# Patient Record
Sex: Male | Born: 1965 | Race: White | Hispanic: No | Marital: Married | Smoking: Former smoker
Health system: Southern US, Community
[De-identification: ages and names within clinical notes are randomized; demographics above are authoritative.]

## PROBLEM LIST (undated history)

## (undated) DIAGNOSIS — F32A Depression, unspecified: Secondary | ICD-10-CM

## (undated) DIAGNOSIS — F329 Major depressive disorder, single episode, unspecified: Secondary | ICD-10-CM

## (undated) DIAGNOSIS — G4730 Sleep apnea, unspecified: Secondary | ICD-10-CM

## (undated) DIAGNOSIS — J449 Chronic obstructive pulmonary disease, unspecified: Secondary | ICD-10-CM

## (undated) DIAGNOSIS — K449 Diaphragmatic hernia without obstruction or gangrene: Secondary | ICD-10-CM

## (undated) DIAGNOSIS — K219 Gastro-esophageal reflux disease without esophagitis: Secondary | ICD-10-CM

## (undated) DIAGNOSIS — N400 Enlarged prostate without lower urinary tract symptoms: Secondary | ICD-10-CM

## (undated) DIAGNOSIS — Z87898 Personal history of other specified conditions: Secondary | ICD-10-CM

## (undated) DIAGNOSIS — I10 Essential (primary) hypertension: Secondary | ICD-10-CM

## (undated) DIAGNOSIS — F419 Anxiety disorder, unspecified: Secondary | ICD-10-CM

## (undated) HISTORY — DX: Anxiety disorder, unspecified: F41.9

## (undated) HISTORY — DX: Benign prostatic hyperplasia without lower urinary tract symptoms: N40.0

## (undated) HISTORY — DX: Major depressive disorder, single episode, unspecified: F32.9

## (undated) HISTORY — PX: WISDOM TOOTH EXTRACTION: SHX21

## (undated) HISTORY — DX: Depression, unspecified: F32.A

## (undated) HISTORY — DX: Diaphragmatic hernia without obstruction or gangrene: K44.9

---

## 1993-07-24 DIAGNOSIS — Z87898 Personal history of other specified conditions: Secondary | ICD-10-CM

## 1993-07-24 HISTORY — DX: Personal history of other specified conditions: Z87.898

## 2005-06-22 ENCOUNTER — Emergency Department: Payer: Self-pay | Admitting: Emergency Medicine

## 2006-05-10 ENCOUNTER — Emergency Department: Payer: Self-pay | Admitting: Emergency Medicine

## 2006-05-10 ENCOUNTER — Other Ambulatory Visit: Payer: Self-pay

## 2006-05-11 ENCOUNTER — Emergency Department: Payer: Self-pay | Admitting: Emergency Medicine

## 2006-05-11 ENCOUNTER — Ambulatory Visit: Payer: Self-pay | Admitting: Emergency Medicine

## 2007-05-31 IMAGING — CT CT STONE STUDY
1 of 2 series · 16 of 32 positions shown, 20 images · non-contrast
Comparison: none

REASON FOR EXAM: Abdominal pain, [HOSPITAL]
COMMENTS:

[Series 2: stone · axial · 0.80mm/px · z∈[-480,-34]mm · 16 of 161 slices shown, 20 images]
[im 6/161  soft-tissue]
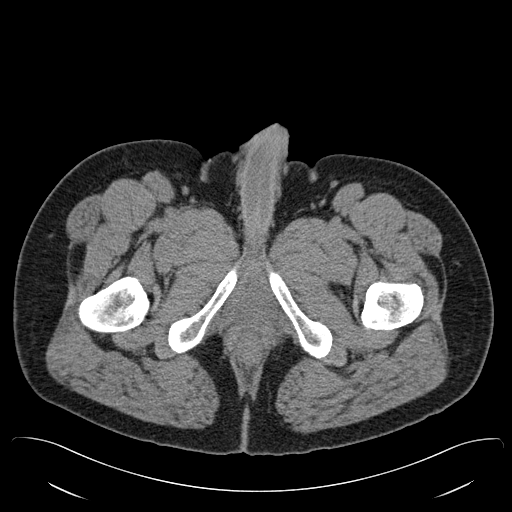
[im 6/161  bone]
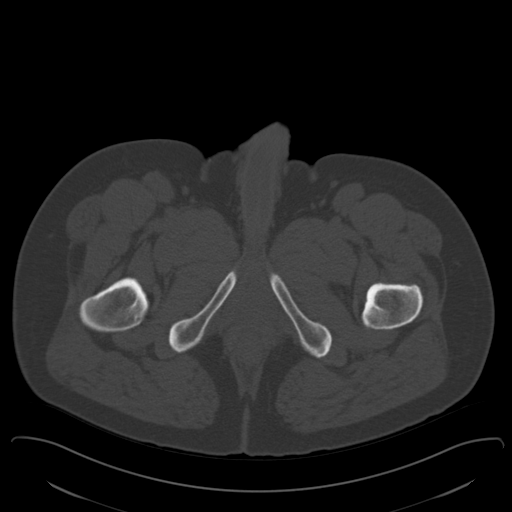
[im 18/161  soft-tissue]
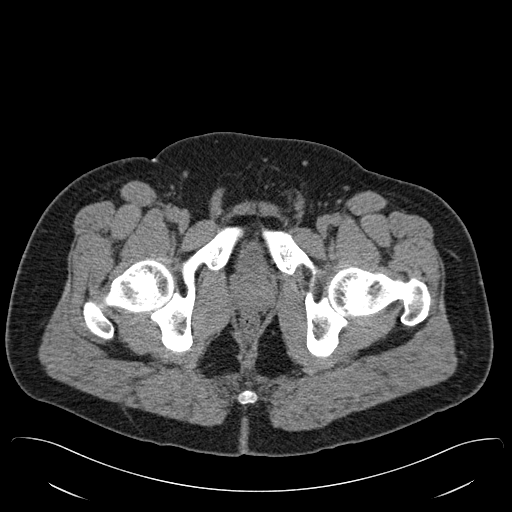
[im 29/161  soft-tissue]
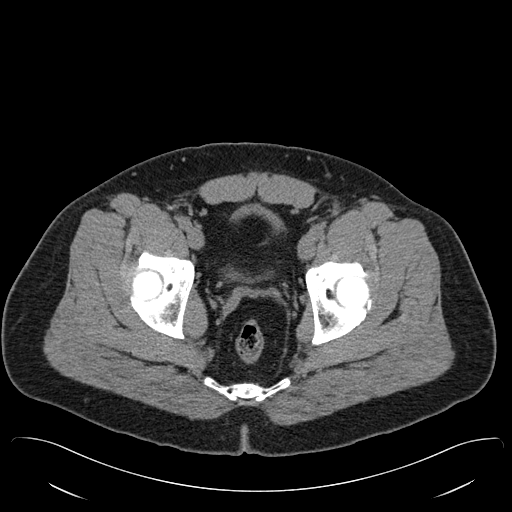
[im 41/161  soft-tissue]
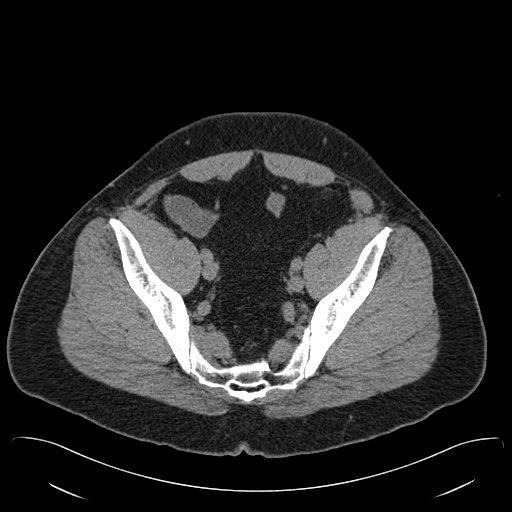
[im 52/161  soft-tissue]
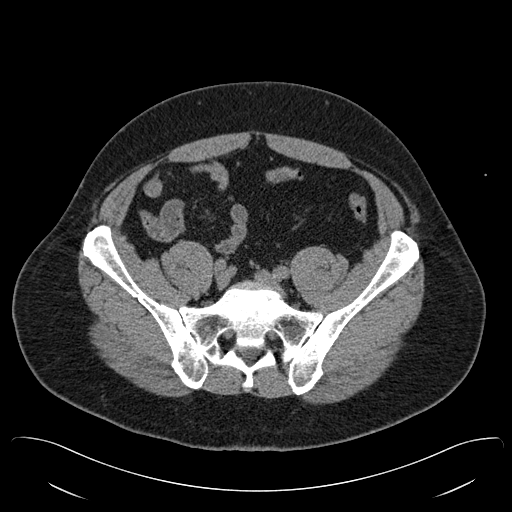
[im 63/161  soft-tissue]
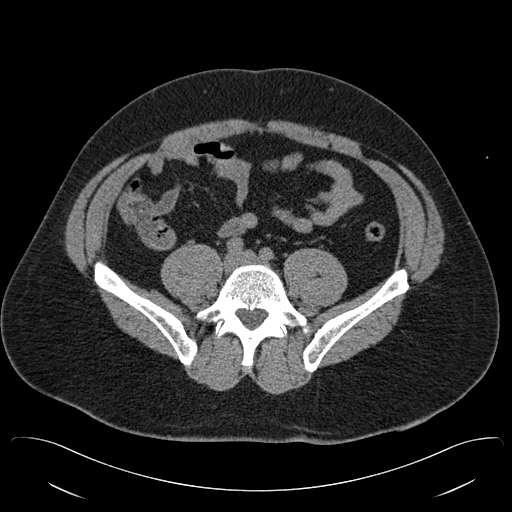
[im 75/161  soft-tissue]
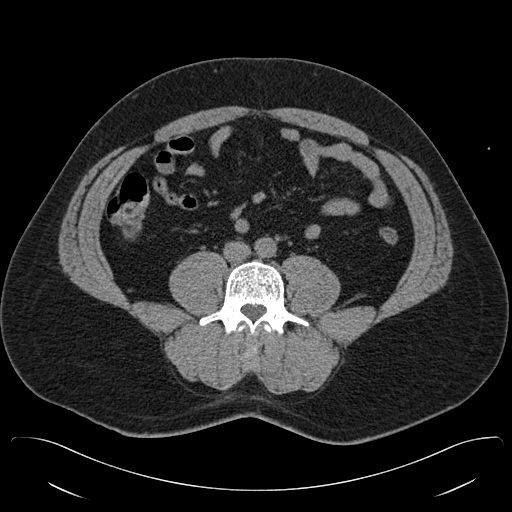
[im 86/161  soft-tissue]
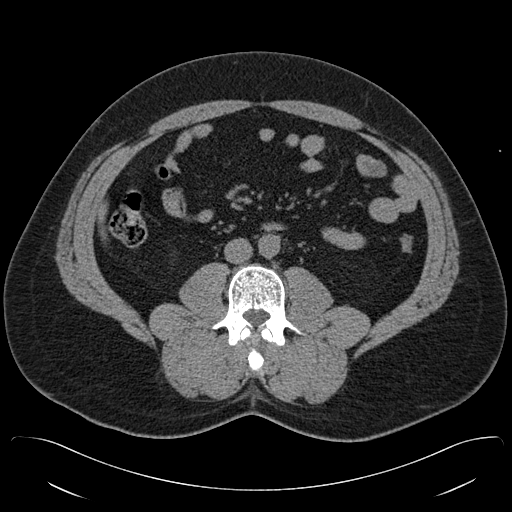
[im 98/161  soft-tissue]
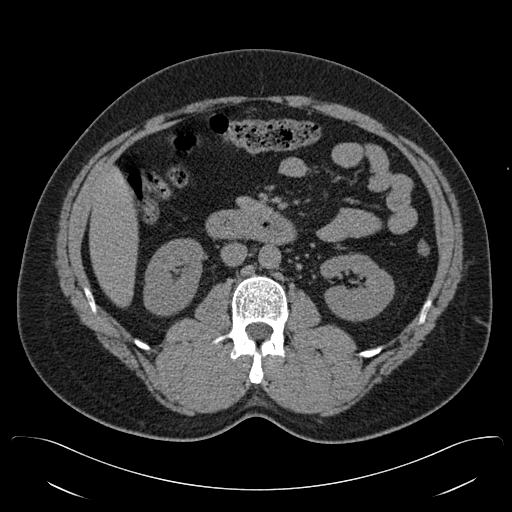
[im 98/161  bone]
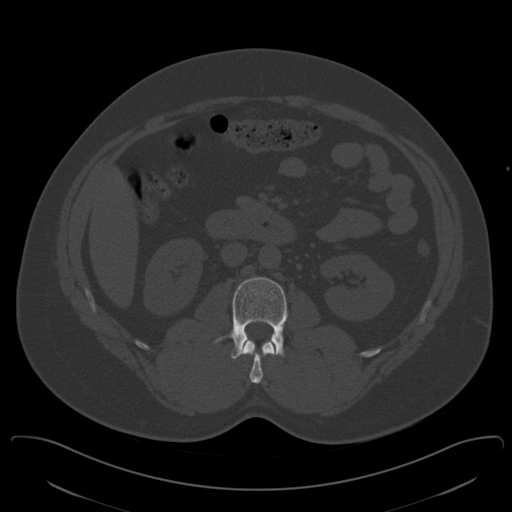
[im 109/161  soft-tissue]
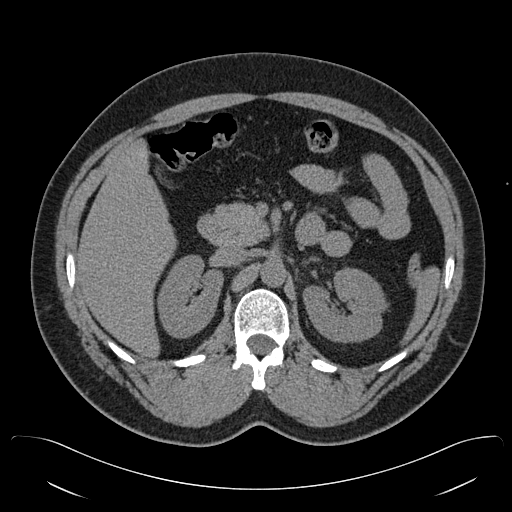
[im 121/161  soft-tissue]
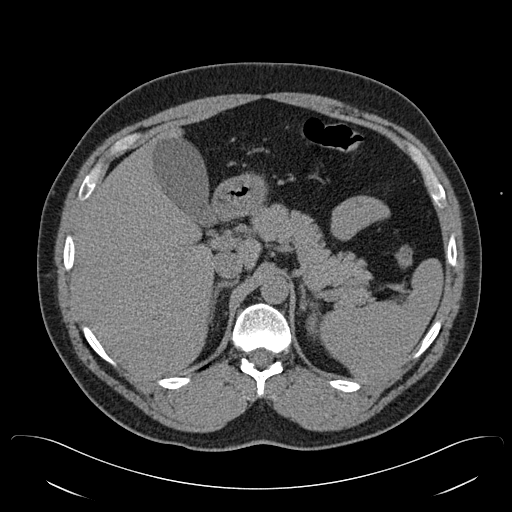
[im 132/161  soft-tissue]
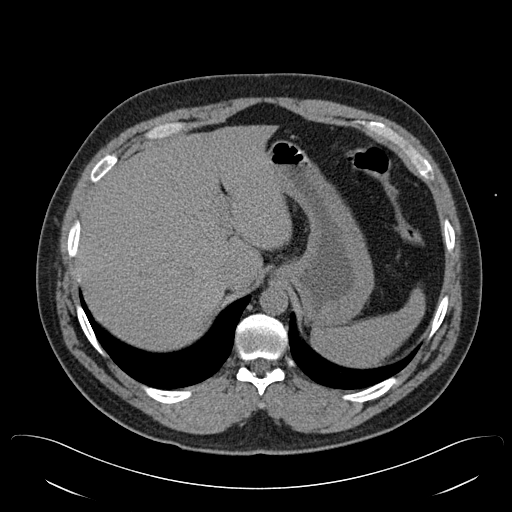
[im 138/161  lung]
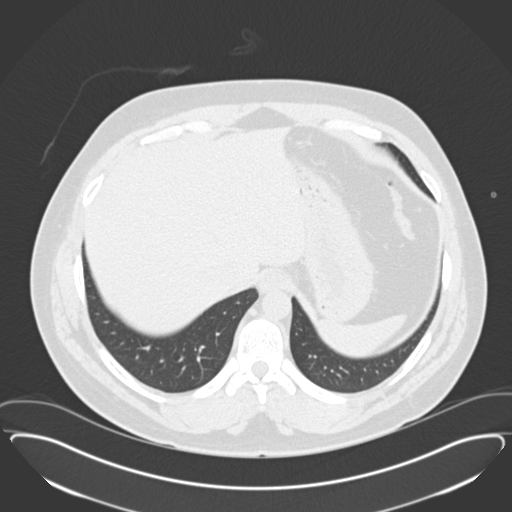
[im 143/161  soft-tissue]
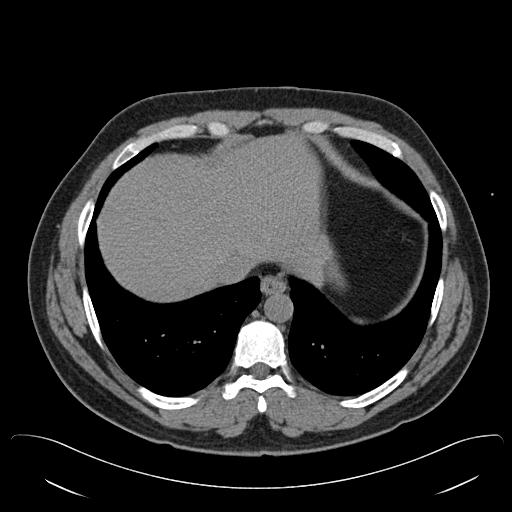
[im 143/161  lung]
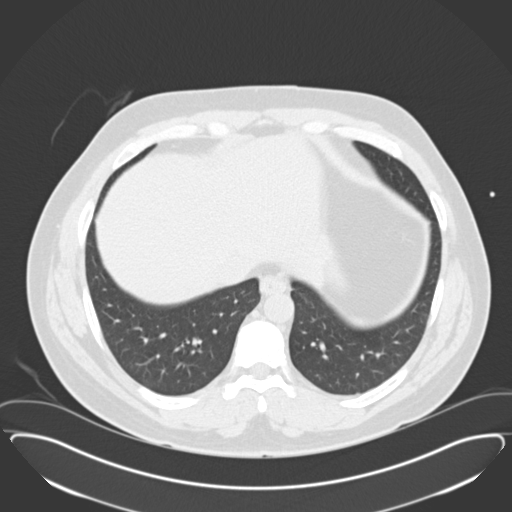
[im 149/161  lung]
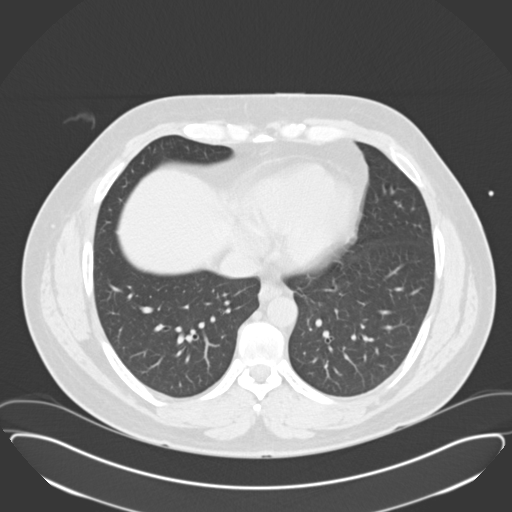
[im 155/161  soft-tissue]
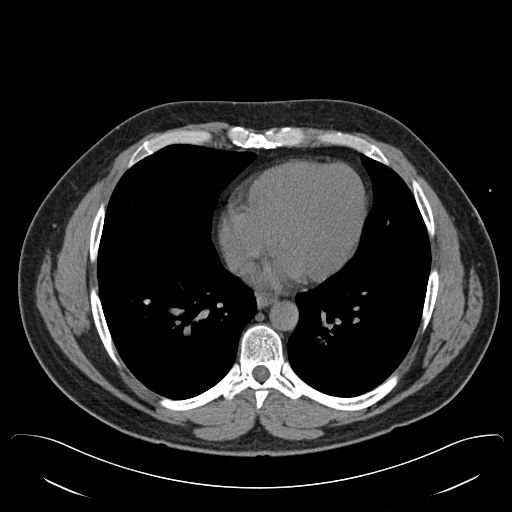
[im 155/161  lung]
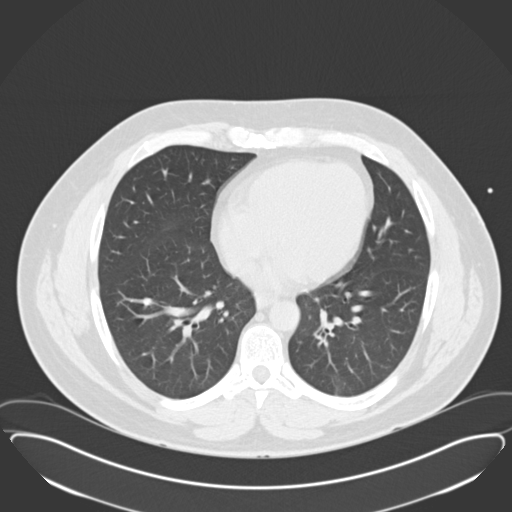

[16 of 32 positions shown; findings below may reference images not displayed]

PROCEDURE:     CT  - CT ABDOMEN /PELVIS WO (STONE)  - May 11, 2006  [DATE]

RESULT:       Unenhanced emergent abdominal and pelvic CT was performed for
LEFT upper quadrant pain and hematuria.  Report was faxed to the Emergency
Room.

No renal or ureteral calculi are identified.  No hydronephrosis.  No major
organ abnormalities are noted on this unenhanced abdominal/pelvic CT.  No
ascites is present.  The accompanying images through the lung bases reveal
the lung bases to be clear with no effusions.
IMPRESSION: No renal or ureteral calculi identified.

## 2007-05-31 IMAGING — CT CT ABD-PELV W/ CM
1 of 2 series · 16 of 32 positions shown, 20 images · non-contrast
Comparison: none

REASON FOR EXAM: diverticulitis
COMMENTS:

[Series 2: soft tissue · axial · 0.79mm/px · z∈[-249,+191]mm · 16 of 98 slices shown, 20 images]
[im 5/98  soft-tissue]
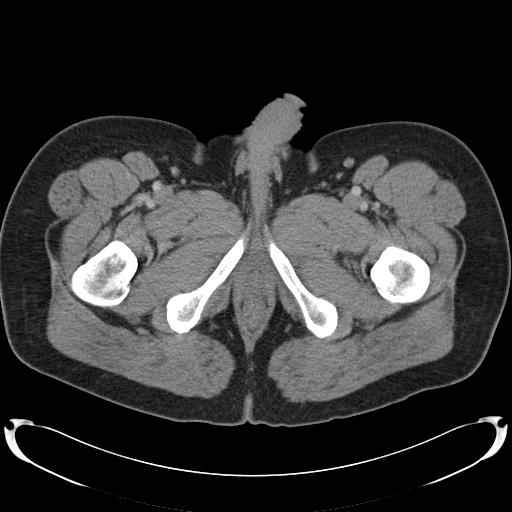
[im 5/98  bone]
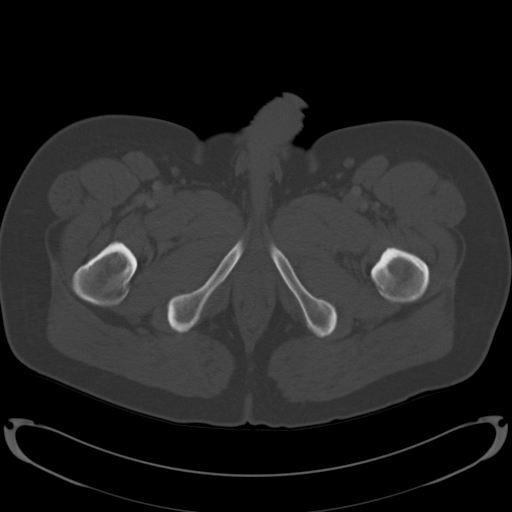
[im 13/98  soft-tissue]
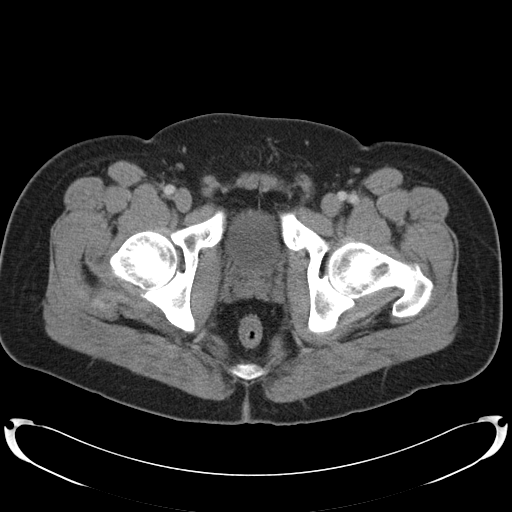
[im 21/98  soft-tissue]
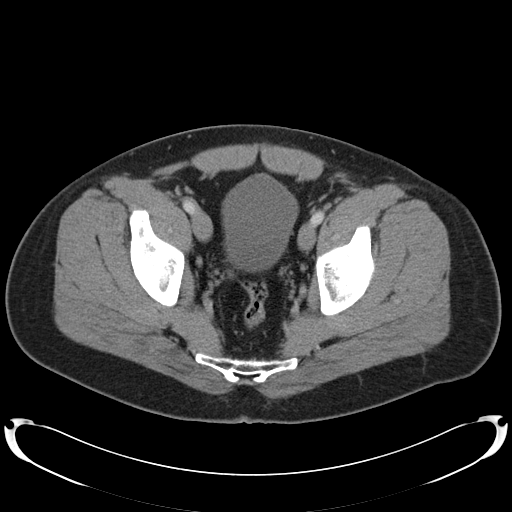
[im 25/98  soft-tissue]
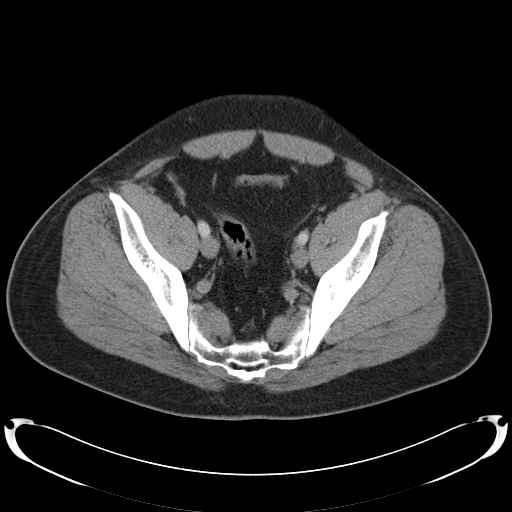
[im 33/98  soft-tissue]
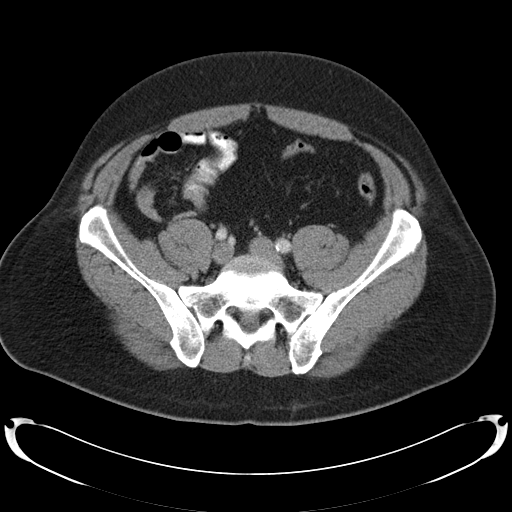
[im 41/98  soft-tissue]
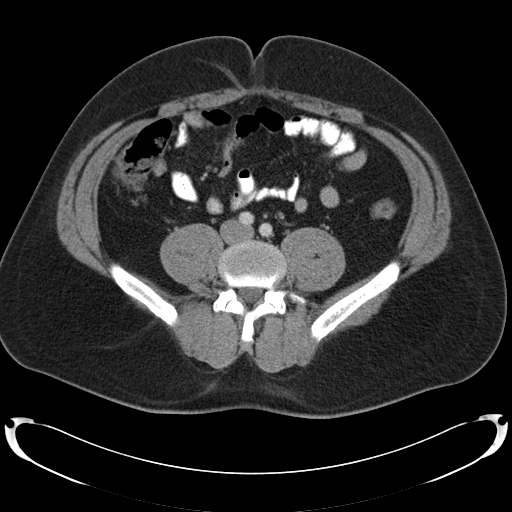
[im 45/98  soft-tissue]
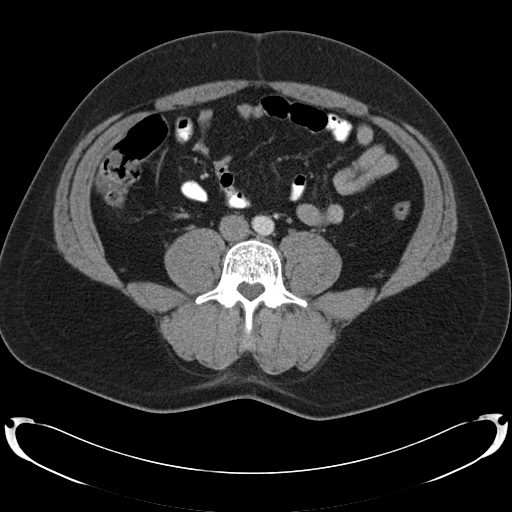
[im 53/98  soft-tissue]
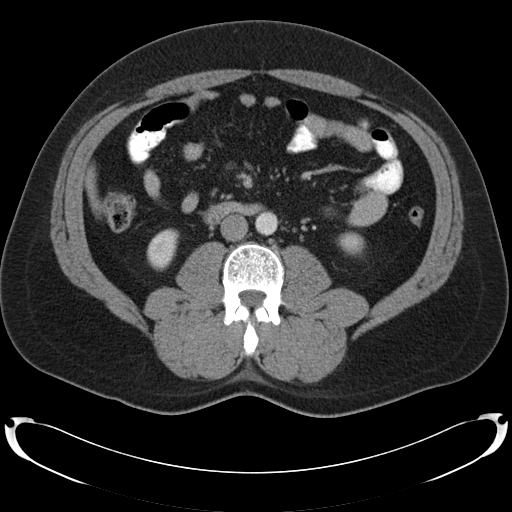
[im 57/98  soft-tissue]
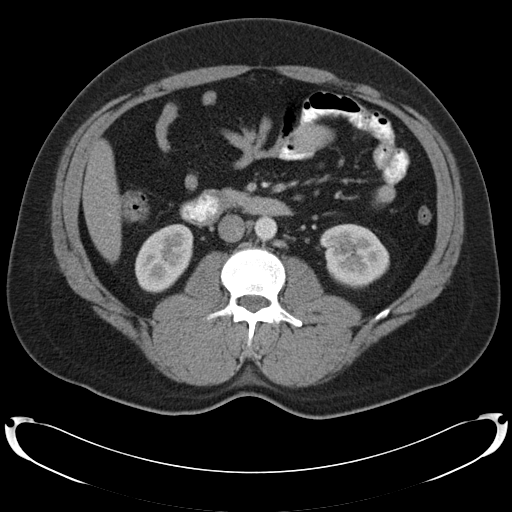
[im 57/98  bone]
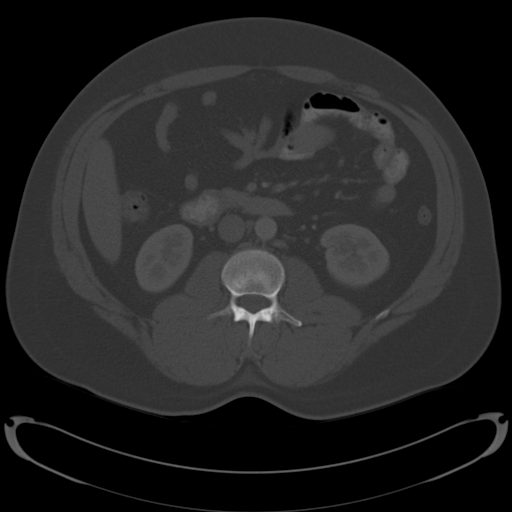
[im 65/98  soft-tissue]
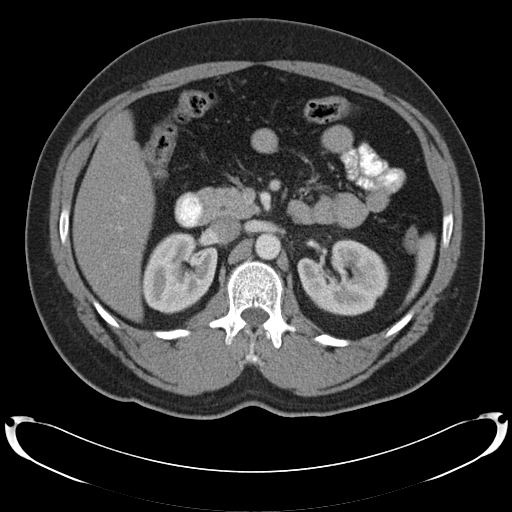
[im 73/98  soft-tissue]
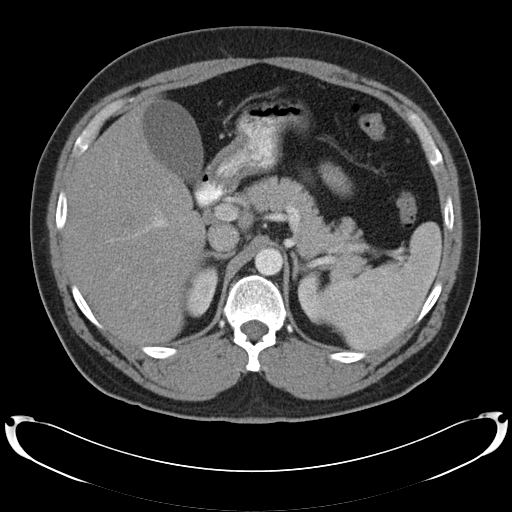
[im 77/98  soft-tissue]
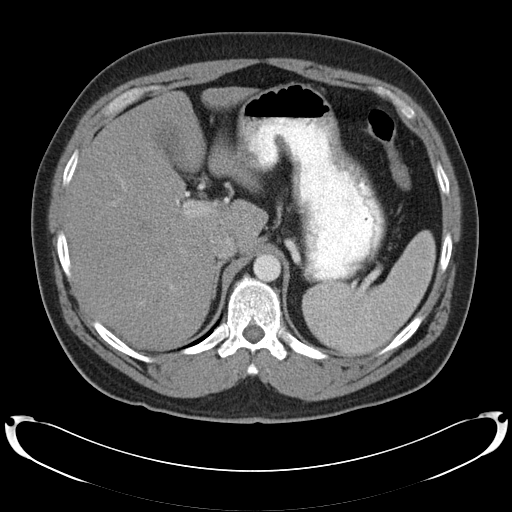
[im 81/98  lung]
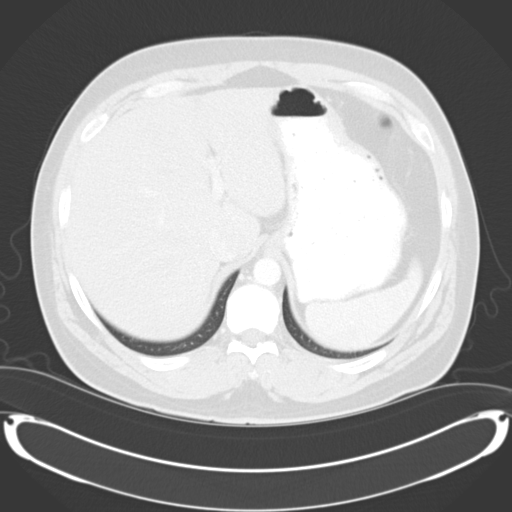
[im 85/98  soft-tissue]
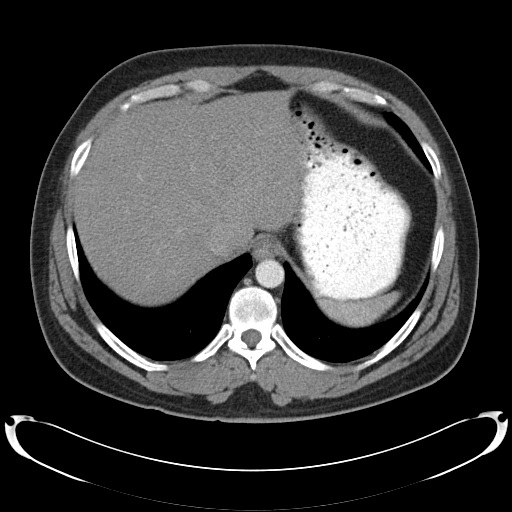
[im 85/98  lung]
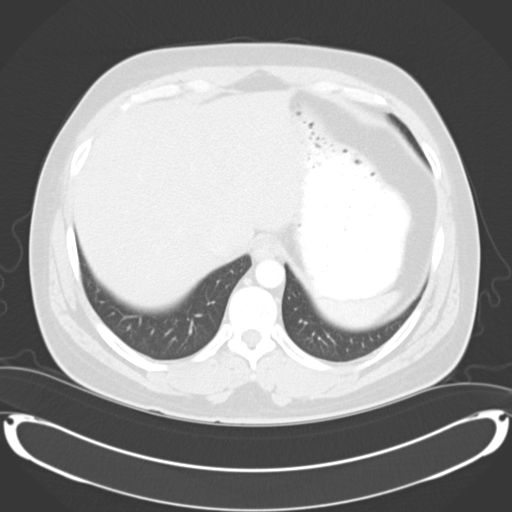
[im 89/98  lung]
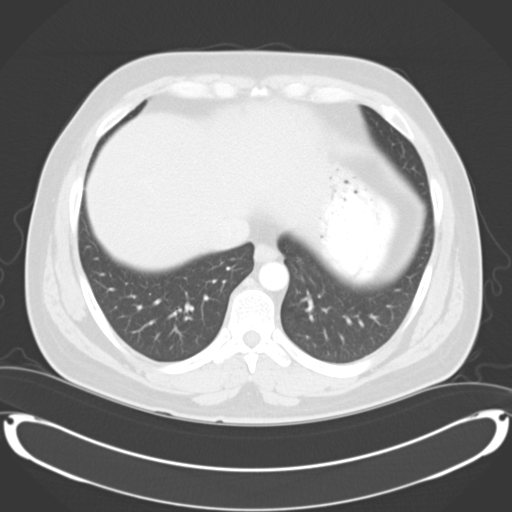
[im 93/98  soft-tissue]
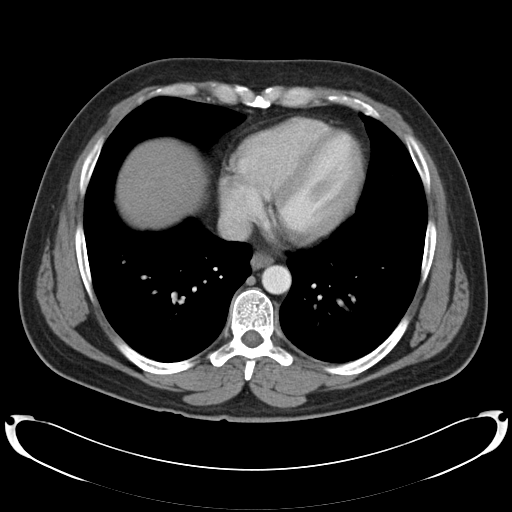
[im 93/98  lung]
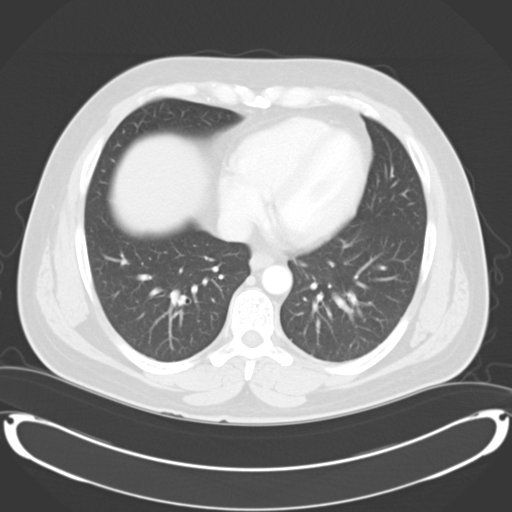

[16 of 32 positions shown; findings below may reference images not displayed]

PROCEDURE:     CT  - CT ABDOMEN / PELVIS  W  - May 11, 2006  [DATE]

RESULT:     Emergent scan is performed for diverticulitis.  The scan is
performed with oral and IV contrast and compared with the exam of the
previous evening, 05/11/06.

No evidence of diverticulitis is seen.  No appendicitis.  Both kidneys
continue to excrete the contrast material.

The visualized lung bases are basically clear.
IMPRESSION: No evidence of diverticulitis.  Report was called to the
emergency room at the conclusion of the dictation.

## 2010-07-24 HISTORY — PX: COLONOSCOPY: SHX174

## 2015-07-08 ENCOUNTER — Ambulatory Visit (INDEPENDENT_AMBULATORY_CARE_PROVIDER_SITE_OTHER): Payer: Worker's Compensation | Admitting: General Surgery

## 2015-07-08 ENCOUNTER — Encounter: Payer: Self-pay | Admitting: *Deleted

## 2015-07-08 ENCOUNTER — Encounter: Payer: Self-pay | Admitting: General Surgery

## 2015-07-08 VITALS — BP 132/82 | HR 78 | Resp 16 | Ht <= 58 in | Wt 214.0 lb

## 2015-07-08 DIAGNOSIS — K4090 Unilateral inguinal hernia, without obstruction or gangrene, not specified as recurrent: Secondary | ICD-10-CM | POA: Diagnosis not present

## 2015-07-08 NOTE — Progress Notes (Signed)
Patient ID: Todd SensingStephen Walter, male   DOB: 09-26-1965, 49 y.o.   MRN: [id number]  Chief Complaint  Patient presents with  . Other    left inguinal hernia    HPI Todd Walter is a 49 y.o. male here today for a evaluation of a left inguinal hernia . He noticed this in Novermeber 14,2016. He was moving some heavy package at work and noticed a sharpe pain in his left groin.  He states he has pain with activity. There is a swelling in left groin which he sometimes can push back and other times it goes down on resting. I have reviewed the history of present illness with the patient. HPI  Past Medical History  Diagnosis Date  . Hiatal hernia   . Prostate enlargement   . Anxiety   . Depression     Past Surgical History  Procedure Laterality Date  . Wisdom tooth extraction    . Colonoscopy  2012    Family History  Problem Relation Age of Onset  . Cancer Father     kidney  . Cancer Brother     Social History Social History  Substance Use Topics  . Smoking status: Former Smoker -- 25 years    Quit date: 07/25/2007  . Smokeless tobacco: Never Used  . Alcohol Use: 0.0 oz/week    0 Standard drinks or equivalent per week     Comment: occasionally    No Known Allergies  Current Outpatient Prescriptions  Medication Sig Dispense Refill  . FLUoxetine (PROZAC) 20 MG tablet Take 20 mg by mouth daily.    . hydrochlorothiazide (HYDRODIURIL) 25 MG tablet     . meloxicam (MOBIC) 15 MG tablet     . omeprazole (PRILOSEC) 20 MG capsule      No current facility-administered medications for this visit.    Review of Systems Review of Systems  Constitutional: Negative.   Respiratory: Negative.   Cardiovascular: Negative.     Blood pressure 132/82, pulse 78, resp. rate 16, height 4\' 10"  (1.473 m), weight 214 lb (97.07 kg).  Physical Exam Physical Exam  Constitutional: He is oriented to person, place, and time. He appears well-developed and well-nourished.  Eyes: Conjunctivae are  normal. No scleral icterus.  Neck: Neck supple.  Cardiovascular: Normal rate, regular rhythm and normal heart sounds.   Pulmonary/Chest: Effort normal and breath sounds normal.  Abdominal: Soft. Normal appearance and bowel sounds are normal. There is no tenderness. A hernia is present. Hernia confirmed positive in the left inguinal area.  Lymphadenopathy:    He has no cervical adenopathy.  Neurological: He is alert and oriented to person, place, and time.  Skin: Skin is warm and dry.    Data Reviewed Notes reviewed  Assessment    Left inguinal hernia-symptomatic    Plan   Discussed hernia repair in full with pt and he is agreeable. Both laparoscopic and open approaches discussed. He is o with laparoscopic approach. Hernia precautions and incarceration were discussed with the patient. If they develop symptoms of an incarcerated hernia, they were encouraged to seek prompt medical attention.  I have recommended repair of the hernia using mesh on an outpatient basis in the near future. The risk of infection was reviewed. The role of prosthetic mesh to minimize the risk of recurrence was reviewed.    Patient's surgery has been scheduled for 07-27-15 at [facility].   This information has been scribed by Dorathy DaftMarsha Hatch RNBC.    PCP:  Evette Doffingurham  Kimberlly Norgard G 07/08/2015,  11:42 AM

## 2015-07-08 NOTE — Patient Instructions (Addendum)

## 2015-07-20 ENCOUNTER — Inpatient Hospital Stay: Admission: RE | Admit: 2015-07-20 | Payer: Self-pay | Source: Ambulatory Visit

## 2015-07-20 ENCOUNTER — Encounter: Payer: Self-pay | Admitting: *Deleted

## 2015-07-20 NOTE — Patient Instructions (Signed)
  Your procedure is scheduled on:July 27, 2015 (Tuesday) Report to Same Day Surgery [facility] Second Floor To find out your arrival time please call [phone] between 1PM - 3PM on July 23, 2015 (Friday)  Remember: Instructions that are not followed completely may result in serious medical risk, up to and including death, or upon the discretion of your surgeon and anesthesiologist your surgery may need to be rescheduled.    ___x_ 1. Do not eat food or drink liquids after midnight. No gum chewing or hard candies.     ___x_ 2. No Alcohol for 24 hours before or after surgery.   ____ 3. Bring all medications with you on the day of surgery if instructed.    ___x_ 4. Notify your doctor if there is any change in your medical condition     (cold, fever, infections).     Do not wear jewelry, make-up, hairpins, clips or nail polish.  Do not wear lotions, powders, or perfumes. You may wear deodorant.  Do not shave 48 hours prior to surgery. Men may shave face and neck.  Do not bring valuables to the hospital.    [facility] belongings or valuables.               Contacts, dentures or bridgework may not be worn into surgery.  Leave your suitcase in the car. After surgery it may be brought to your room.  For patients admitted to the hospital, discharge time is determined by your                treatment team.   Patients discharged the day of surgery will not be allowed to drive home.   Please read over the following fact sheets that you were given:   Surgical Site Infection Prevention   _x___ Take these medicines the morning of surgery with A SIP OF WATER:    1. Omeprazole  2.   3.   4.  5.  6.  ____ Fleet Enema (as directed)   _x___ Use CHG Soap as directed  ____ Use inhalers on the day of surgery  ____ Stop metformin 2 days prior to surgery    ____ Take 1/2 of usual insulin dose the night before surgery and none on the morning of surgery.    ____ Stop Coumadin/Plavix/aspirin on   __x__ Stop Anti-inflammatories on (STOP MELOXICAM  NOW) NO NSAIDS , TYLENOL OK TO TAKE FOR PAIN IF NEEDED   ____ Stop supplements until after surgery.    _x__ Bring C-Pap to the hospital.

## 2015-07-22 ENCOUNTER — Encounter
Admission: RE | Admit: 2015-07-22 | Discharge: 2015-07-22 | Disposition: A | Discharge status: Home / Self Care | Payer: Worker's Compensation | Source: Ambulatory Visit | Attending: General Surgery | Admitting: General Surgery

## 2015-07-22 DIAGNOSIS — Z01812 Encounter for preprocedural laboratory examination: Secondary | ICD-10-CM | POA: Insufficient documentation

## 2015-07-22 DIAGNOSIS — Z01810 Encounter for preprocedural cardiovascular examination: Secondary | ICD-10-CM | POA: Diagnosis present

## 2015-07-22 LAB — CBC
HCT: 43.2 % (ref 40.0–52.0)
HEMOGLOBIN: 14.4 g/dL (ref 13.0–18.0)
MCH: 28.3 pg (ref 26.0–34.0)
MCHC: 33.4 g/dL (ref 32.0–36.0)
MCV: 84.7 fL (ref 80.0–100.0)
PLATELETS: 303 10*3/uL (ref 150–440)
RBC: 5.1 MIL/uL (ref 4.40–5.90)
RDW: 13.4 % (ref 11.5–14.5)
WBC: 8.7 10*3/uL (ref 3.8–10.6)

## 2015-07-22 LAB — DIFFERENTIAL
BASOS PCT: 1 %
Basophils Absolute: 0.1 10*3/uL (ref 0–0.1)
EOS PCT: 5 %
Eosinophils Absolute: 0.4 10*3/uL (ref 0–0.7)
LYMPHS ABS: 2.7 10*3/uL (ref 1.0–3.6)
LYMPHS PCT: 31 %
MONO ABS: 0.6 10*3/uL (ref 0.2–1.0)
Monocytes Relative: 7 %
NEUTROS ABS: 4.9 10*3/uL (ref 1.4–6.5)
NEUTROS PCT: 56 %

## 2015-07-22 LAB — POTASSIUM: POTASSIUM: 4.1 mmol/L (ref 3.5–5.1)

## 2015-07-27 ENCOUNTER — Encounter
Admission: RE | Disposition: A | Discharge status: Home / Self Care | Payer: Self-pay | Source: Ambulatory Visit | Attending: General Surgery

## 2015-07-27 ENCOUNTER — Ambulatory Visit: Payer: Worker's Compensation | Admitting: Anesthesiology

## 2015-07-27 ENCOUNTER — Ambulatory Visit
Admission: RE | Admit: 2015-07-27 | Discharge: 2015-07-27 | Disposition: A | Discharge status: Home / Self Care | Payer: Worker's Compensation | Source: Ambulatory Visit | Attending: General Surgery | Admitting: General Surgery

## 2015-07-27 DIAGNOSIS — K4090 Unilateral inguinal hernia, without obstruction or gangrene, not specified as recurrent: Secondary | ICD-10-CM

## 2015-07-27 DIAGNOSIS — Z9889 Other specified postprocedural states: Secondary | ICD-10-CM | POA: Diagnosis not present

## 2015-07-27 DIAGNOSIS — F419 Anxiety disorder, unspecified: Secondary | ICD-10-CM | POA: Diagnosis not present

## 2015-07-27 DIAGNOSIS — Z8051 Family history of malignant neoplasm of kidney: Secondary | ICD-10-CM | POA: Insufficient documentation

## 2015-07-27 DIAGNOSIS — F329 Major depressive disorder, single episode, unspecified: Secondary | ICD-10-CM | POA: Diagnosis not present

## 2015-07-27 DIAGNOSIS — N400 Enlarged prostate without lower urinary tract symptoms: Secondary | ICD-10-CM | POA: Diagnosis not present

## 2015-07-27 DIAGNOSIS — K4020 Bilateral inguinal hernia, without obstruction or gangrene, not specified as recurrent: Secondary | ICD-10-CM | POA: Insufficient documentation

## 2015-07-27 DIAGNOSIS — Z87891 Personal history of nicotine dependence: Secondary | ICD-10-CM | POA: Diagnosis not present

## 2015-07-27 DIAGNOSIS — K449 Diaphragmatic hernia without obstruction or gangrene: Secondary | ICD-10-CM | POA: Diagnosis not present

## 2015-07-27 HISTORY — DX: Sleep apnea, unspecified: G47.30

## 2015-07-27 HISTORY — PX: INGUINAL HERNIA REPAIR: SHX194

## 2015-07-27 HISTORY — DX: Chronic obstructive pulmonary disease, unspecified: J44.9

## 2015-07-27 HISTORY — DX: Essential (primary) hypertension: I10

## 2015-07-27 HISTORY — DX: Gastro-esophageal reflux disease without esophagitis: K21.9

## 2015-07-27 HISTORY — DX: Personal history of other specified conditions: Z87.898

## 2015-07-27 SURGERY — REPAIR, HERNIA, INGUINAL, LAPAROSCOPIC
Anesthesia: General | Laterality: Bilateral | Wound class: Clean

## 2015-07-27 MED ORDER — ACETAMINOPHEN 10 MG/ML IV SOLN
INTRAVENOUS | Status: DC | PRN
  Administered 2015-07-27: 1000 mg via INTRAVENOUS

## 2015-07-27 MED ORDER — PROMETHAZINE HCL 25 MG/ML IJ SOLN
6.2500 mg | INTRAMUSCULAR | Status: DC | PRN
  Administered 2015-07-27: 6.25 mg via INTRAVENOUS

## 2015-07-27 MED ORDER — PHENYLEPHRINE HCL 10 MG/ML IJ SOLN
INTRAMUSCULAR | Status: DC | PRN
  Administered 2015-07-27 (×2): 100 ug via INTRAVENOUS

## 2015-07-27 MED ORDER — CEFAZOLIN SODIUM-DEXTROSE 2-3 GM-% IV SOLR
INTRAVENOUS | Status: AC
  Filled 2015-07-27: qty 50

## 2015-07-27 MED ORDER — FENTANYL CITRATE (PF) 100 MCG/2ML IJ SOLN
25.0000 ug | INTRAMUSCULAR | Status: DC | PRN
  Administered 2015-07-27: 25 ug via INTRAVENOUS

## 2015-07-27 MED ORDER — LACTATED RINGERS IV SOLN
INTRAVENOUS | Status: DC
  Administered 2015-07-27 (×2): via INTRAVENOUS

## 2015-07-27 MED ORDER — ROCURONIUM BROMIDE 100 MG/10ML IV SOLN
INTRAVENOUS | Status: DC | PRN
  Administered 2015-07-27: 40 mg via INTRAVENOUS
  Administered 2015-07-27: 10 mg via INTRAVENOUS

## 2015-07-27 MED ORDER — KETOROLAC TROMETHAMINE 30 MG/ML IJ SOLN
INTRAMUSCULAR | Status: DC | PRN
  Administered 2015-07-27: 30 mg via INTRAVENOUS

## 2015-07-27 MED ORDER — GLYCOPYRROLATE 0.2 MG/ML IJ SOLN
INTRAMUSCULAR | Status: DC | PRN
  Administered 2015-07-27: .6 mg via INTRAVENOUS

## 2015-07-27 MED ORDER — OXYCODONE-ACETAMINOPHEN 5-325 MG PO TABS
1.0000 | ORAL_TABLET | ORAL | Status: AC | PRN
  Administered 2015-07-27: 1 via ORAL

## 2015-07-27 MED ORDER — PROMETHAZINE HCL 25 MG/ML IJ SOLN
INTRAMUSCULAR | Status: AC
  Filled 2015-07-27: qty 1

## 2015-07-27 MED ORDER — SODIUM CHLORIDE 0.9 % IJ SOLN
INTRAMUSCULAR | Status: AC
  Filled 2015-07-27: qty 10

## 2015-07-27 MED ORDER — MIDAZOLAM HCL 2 MG/2ML IJ SOLN
INTRAMUSCULAR | Status: DC | PRN
  Administered 2015-07-27: 2 mg via INTRAVENOUS

## 2015-07-27 MED ORDER — DEXAMETHASONE SODIUM PHOSPHATE 10 MG/ML IJ SOLN
INTRAMUSCULAR | Status: DC | PRN
  Administered 2015-07-27: 10 mg via INTRAVENOUS

## 2015-07-27 MED ORDER — CEFAZOLIN SODIUM-DEXTROSE 2-3 GM-% IV SOLR
2.0000 g | INTRAVENOUS | Status: AC
  Administered 2015-07-27: 2 g via INTRAVENOUS

## 2015-07-27 MED ORDER — ACETAMINOPHEN 10 MG/ML IV SOLN
INTRAVENOUS | Status: AC
  Filled 2015-07-27: qty 100

## 2015-07-27 MED ORDER — OXYCODONE-ACETAMINOPHEN 5-325 MG PO TABS
ORAL_TABLET | ORAL | Status: AC
  Filled 2015-07-27: qty 1

## 2015-07-27 MED ORDER — LIDOCAINE HCL (CARDIAC) 20 MG/ML IV SOLN
INTRAVENOUS | Status: DC | PRN
  Administered 2015-07-27: 40 mg via INTRAVENOUS

## 2015-07-27 MED ORDER — FENTANYL CITRATE (PF) 100 MCG/2ML IJ SOLN
INTRAMUSCULAR | Status: AC
  Filled 2015-07-27: qty 2

## 2015-07-27 MED ORDER — NEOSTIGMINE METHYLSULFATE 10 MG/10ML IV SOLN
INTRAVENOUS | Status: DC | PRN
  Administered 2015-07-27: 4 mg via INTRAVENOUS

## 2015-07-27 MED ORDER — CHLORHEXIDINE GLUCONATE 4 % EX LIQD: 1.0000 "application " | Freq: Once | CUTANEOUS | Status: DC

## 2015-07-27 MED ORDER — ONDANSETRON HCL 4 MG/2ML IJ SOLN
INTRAMUSCULAR | Status: DC | PRN
  Administered 2015-07-27: 4 mg via INTRAVENOUS

## 2015-07-27 MED ORDER — PROPOFOL 10 MG/ML IV BOLUS
INTRAVENOUS | Status: DC | PRN
  Administered 2015-07-27: 170 mg via INTRAVENOUS

## 2015-07-27 MED ORDER — OXYCODONE-ACETAMINOPHEN 5-325 MG PO TABS: 1.0000 | ORAL_TABLET | ORAL | Status: AC | PRN

## 2015-07-27 MED ORDER — FENTANYL CITRATE (PF) 100 MCG/2ML IJ SOLN
INTRAMUSCULAR | Status: DC | PRN
  Administered 2015-07-27: 50 ug via INTRAVENOUS
  Administered 2015-07-27: 25 ug via INTRAVENOUS
  Administered 2015-07-27: 75 ug via INTRAVENOUS
  Administered 2015-07-27: 50 ug via INTRAVENOUS

## 2015-07-27 SURGICAL SUPPLY — 33 items
BLADE SURG 11 STRL SS SAFETY (MISCELLANEOUS) ×3 IMPLANT
CANISTER SUCT 1200ML W/VALVE (MISCELLANEOUS) ×3 IMPLANT
CANNULA DILATOR 12 W/SLV (CANNULA) ×2 IMPLANT
CANNULA DILATOR 12MM W/SLV (CANNULA) ×1
CATH TRAY 16F METER LATEX (MISCELLANEOUS) ×3 IMPLANT
CHLORAPREP W/TINT 26ML (MISCELLANEOUS) ×3 IMPLANT
DEFOGGER SCOPE WARMER CLEARIFY (MISCELLANEOUS) ×3 IMPLANT
DEVICE SECURE STRAP 25 ABSORB (INSTRUMENTS) ×6 IMPLANT
DRAPE INCISE IOBAN 66X45 STRL (DRAPES) ×3 IMPLANT
GLOVE BIO SURGEON STRL SZ7 (GLOVE) ×3 IMPLANT
GOWN STRL REUS W/ TWL LRG LVL3 (GOWN DISPOSABLE) ×2 IMPLANT
GOWN STRL REUS W/TWL LRG LVL3 (GOWN DISPOSABLE) ×4
GRASPER SUT TROCAR 14GX15 (MISCELLANEOUS) ×3 IMPLANT
IRRIGATION STRYKERFLOW (MISCELLANEOUS) IMPLANT
IRRIGATOR STRYKERFLOW (MISCELLANEOUS)
IV LACTATED RINGERS 1000ML (IV SOLUTION) IMPLANT
KIT RM TURNOVER STRD PROC AR (KITS) ×3 IMPLANT
LABEL OR SOLS (LABEL) ×3 IMPLANT
LIQUID BAND (GAUZE/BANDAGES/DRESSINGS) ×3 IMPLANT
MESH 3DMAX 3X5 LT MED (Mesh General) ×3 IMPLANT
MESH 3DMAX 3X5 RT MED (Mesh General) ×3 IMPLANT
NEEDLE VERESS 14GA 120MM (NEEDLE) ×3 IMPLANT
PACK LAP CHOLECYSTECTOMY (MISCELLANEOUS) ×3 IMPLANT
PAD GROUND ADULT SPLIT (MISCELLANEOUS) ×3 IMPLANT
SCISSORS METZENBAUM CVD 33 (INSTRUMENTS) ×3 IMPLANT
SHEARS HARMONIC ACE PLUS 36CM (ENDOMECHANICALS) IMPLANT
SLEEVE ENDOPATH XCEL 5M (ENDOMECHANICALS) ×3 IMPLANT
SUT VIC AB 0 CT2 27 (SUTURE) ×3 IMPLANT
SUT VIC AB 4-0 FS2 27 (SUTURE) ×3 IMPLANT
TROCAR XCEL 12X100 BLDLESS (ENDOMECHANICALS) ×3 IMPLANT
TROCAR XCEL NON-BLD 5MMX100MML (ENDOMECHANICALS) ×3 IMPLANT
TUBING INSUFFLATOR HI FLOW (MISCELLANEOUS) ×3 IMPLANT
WATER STERILE IRR 1000ML POUR (IV SOLUTION) IMPLANT

## 2015-07-27 NOTE — OR Nursing (Signed)
Patient very restless and anxious on awakening, also c/o some nausea.

## 2015-07-27 NOTE — H&P (View-Only) (Signed)
Patient ID: Todd Walter, male   DOB: 03/15/1966, 49 y.o.   MRN: [id number]  Chief Complaint  Patient presents with  . Other    left inguinal hernia    HPI Todd Walter is a 49 y.o. male here today for a evaluation of a left inguinal hernia . He noticed this in Novermeber 14,2016. He was moving some heavy package at work and noticed a sharpe pain in his left groin.  He states he has pain with activity. There is a swelling in left groin which he sometimes can push back and other times it goes down on resting. I have reviewed the history of present illness with the patient. HPI  Past Medical History  Diagnosis Date  . Hiatal hernia   . Prostate enlargement   . Anxiety   . Depression     Past Surgical History  Procedure Laterality Date  . Wisdom tooth extraction    . Colonoscopy  2012    Family History  Problem Relation Age of Onset  . Cancer Father     kidney  . Cancer Brother     Social History Social History  Substance Use Topics  . Smoking status: Former Smoker -- 25 years    Quit date: 07/25/2007  . Smokeless tobacco: Never Used  . Alcohol Use: 0.0 oz/week    0 Standard drinks or equivalent per week     Comment: occasionally    No Known Allergies  Current Outpatient Prescriptions  Medication Sig Dispense Refill  . FLUoxetine (PROZAC) 20 MG tablet Take 20 mg by mouth daily.    . hydrochlorothiazide (HYDRODIURIL) 25 MG tablet     . meloxicam (MOBIC) 15 MG tablet     . omeprazole (PRILOSEC) 20 MG capsule      No current facility-administered medications for this visit.    Review of Systems Review of Systems  Constitutional: Negative.   Respiratory: Negative.   Cardiovascular: Negative.     Blood pressure 132/82, pulse 78, resp. rate 16, height 4' 10" (1.473 m), weight 214 lb (97.07 kg).  Physical Exam Physical Exam  Constitutional: He is oriented to person, place, and time. He appears well-developed and well-nourished.  Eyes: Conjunctivae are  normal. No scleral icterus.  Neck: Neck supple.  Cardiovascular: Normal rate, regular rhythm and normal heart sounds.   Pulmonary/Chest: Effort normal and breath sounds normal.  Abdominal: Soft. Normal appearance and bowel sounds are normal. There is no tenderness. A hernia is present. Hernia confirmed positive in the left inguinal area.  Lymphadenopathy:    He has no cervical adenopathy.  Neurological: He is alert and oriented to person, place, and time.  Skin: Skin is warm and dry.    Data Reviewed Notes reviewed  Assessment    Left inguinal hernia-symptomatic    Plan   Discussed hernia repair in full with pt and he is agreeable. Both laparoscopic and open approaches discussed. He is o with laparoscopic approach. Hernia precautions and incarceration were discussed with the patient. If they develop symptoms of an incarcerated hernia, they were encouraged to seek prompt medical attention.  I have recommended repair of the hernia using mesh on an outpatient basis in the near future. The risk of infection was reviewed. The role of prosthetic mesh to minimize the risk of recurrence was reviewed.    Patient's surgery has been scheduled for 07-27-15 at [facility].   This information has been scribed by Marsha Hatch RNBC.    PCP:  [place]  SANKAR,SEEPLAPUTHUR G 07/08/2015,   11:42 AM

## 2015-07-27 NOTE — Discharge Instructions (Signed)

## 2015-07-27 NOTE — Anesthesia Preprocedure Evaluation (Signed)
Anesthesia Evaluation  Patient identified by MRN, date of birth, ID band Patient awake    Reviewed: Allergy & Precautions, H&P , NPO status , Patient's Chart, lab work & pertinent test results, reviewed documented beta blocker date and time   Airway Mallampati: I  TM Distance: >3 FB Neck ROM: full    Dental no notable dental hx. (+) Caps, Teeth Intact   Pulmonary sleep apnea and Continuous Positive Airway Pressure Ventilation , COPD, Recent URI , Residual Cough, former smoker,    Pulmonary exam normal breath sounds clear to auscultation       Cardiovascular Exercise Tolerance: Good hypertension, (-) angina(-) CAD, (-) Past MI, (-) Cardiac Stents and (-) CABG Normal cardiovascular exam(-) dysrhythmias (-) Valvular Problems/Murmurs Rhythm:regular Rate:Normal     Neuro/Psych PSYCHIATRIC DISORDERS (Depression and anxiety) negative neurological ROS     GI/Hepatic Neg liver ROS, hiatal hernia, GERD  Medicated and Controlled,  Endo/Other  negative endocrine ROS  Renal/GU negative Renal ROS  negative genitourinary   Musculoskeletal   Abdominal   Peds  Hematology negative hematology ROS (+)   Anesthesia Other Findings Past Medical History:   Hiatal hernia                                                Prostate enlargement                                         Anxiety                                                      Depression                                                   Hypertension                                                 Sleep apnea                                                  COPD (chronic obstructive pulmonary disease) (*              GERD (gastroesophageal reflux disease)                       H/O syncope                                     1995         Reproductive/Obstetrics negative OB ROS  Anesthesia Physical Anesthesia Plan  ASA:  III  Anesthesia Plan: General   Post-op Pain Management:    Induction:   Airway Management Planned:   Additional Equipment:   Intra-op Plan:   Post-operative Plan:   Informed Consent: I have reviewed the patients History and Physical, chart, labs and discussed the procedure including the risks, benefits and alternatives for the proposed anesthesia with the patient or authorized representative who has indicated his/her understanding and acceptance.   Dental Advisory Given  Plan Discussed with: Anesthesiologist, CRNA and Surgeon  Anesthesia Plan Comments:         Anesthesia Quick Evaluation

## 2015-07-27 NOTE — Op Note (Signed)
Preop diagnosis: Left inguinal hernia and possible right inguinal hernia  Post op diagnosis: Bilateral inguinal hernias both indirect  Operation: Laparoscopy and repair left inguinal hernia and repair of right inguinal hernia  Surgeon: S.G.Jahsiah Carpenter  Assistant:     Anesthesia: Gen.  Complications: None  EBL: Minimal  Drains: None  Description: Patient was put to sleep in supine position the operating table Foley catheter was inserted and the abdomen prepped and draped sterile field. Timeout was performed. Initial port incision was made just at the upper lip of the umbilicus and a Veress needle introduced in the peritoneal cavity verified of the hanging drop method and pneumoperitoneum was obtained and a 10 mm port was placed which was subsequently switched out to 11 mm port. Camera was introduced and visualization of the inguinal canal revealed a very large left in indirect hernia with a portion of the sigmoid the colon adjacent to the hernia. To the appendices coli from the sigmoid where adherent to the peritoneal opening at the site and these were subsequently taken down with cautery. 2 lateral 5 mm ports were placed. On the right side the patient did have a small indirect hernia. Left inguinal hernia repair was accomplished first the peritoneum was opened just superior to the hernial opening from the lateral to medial aspect and then the preperitoneal space was dissected until the structures here were easily identified the sac was then freed completely from the inguinal canal and from the surrounding cord structures which were identified. Pubic tubercle was outlined medially and the inguinal ligament laterally. The vas deferens and the inferior epigastric artery were also identified. With satisfactory reduction of the hernia and exposure of the posterior wall a medium size Bard 3-D mesh was placed in the peritoneal cavity and placed up against the left inguinal area. Secure strap was used to tack  it to the pubic tubercle and the muscular tissue medially the inguinal ligament below and the few along the superior edge. The peritoneal covering was then reapproximated to cover the mesh also using the secure strap. The right inguinal hernia was repaired in a similar fashion. After the peritoneum was opened this sac was dissected off from the internal ring area. Pubic tubercle and the inguinal ligament well outlined. The medium size Bard 3-D mesh was then used to repair the posterior wall similar to the left side. Peritoneal covering was reapproximated. After ensuring hemostasis the deep the ports were removed and the fascial opening the umbilicus closed with a 0 Vicryl stitch. All skin incisions were closed with subcuticular 4-0 Vicryl covered with liqui  ban patient subsequently was extubated and returned recovery room stable condition.

## 2015-07-27 NOTE — Interval H&P Note (Signed)
History and Physical Interval Note:  07/27/2015 8:29 AM  Todd Walter  has presented today for surgery, with the diagnosis of LEFT INGUINAL HERNIA  The various methods of treatment have been discussed with the patient and family. After consideration of risks, benefits and other options for treatment, the patient has consented to  Procedure(s): LAPAROSCOPIC INGUINAL HERNIA (Left) as a surgical intervention .  The patient's history has been reviewed, patient examined, no change in status, stable for surgery.  I have reviewed the patient's chart and labs.  Questions were answered to the patient's satisfaction.     Malaiyah Achorn G

## 2015-07-27 NOTE — Transfer of Care (Signed)
Immediate Anesthesia Transfer of Care Note  Patient: Todd SensingStephen Bevilacqua  Procedure(s) Performed: Procedure(s) with comments: LAPAROSCOPIC INGUINAL HERNIA (Bilateral) - consented for left, once inside noticed right sided defect as well  Patient Location: PACU  Anesthesia Type:General  Level of Consciousness: sedated  Airway & Oxygen Therapy: Patient Spontanous Breathing and Patient connected to face mask oxygen  Post-op Assessment: Report given to RN and Post -op Vital signs reviewed and stable  Post vital signs: Reviewed and stable  Last Vitals:  Filed Vitals:   07/27/15 0854 07/27/15 1211  BP: 156/106 150/90  Pulse: 84 81  Temp: 36.6 C 36 C  Resp: 16 17    Complications: No apparent anesthesia complications

## 2015-07-27 NOTE — Anesthesia Procedure Notes (Signed)
Procedure Name: Intubation Date/Time: 07/27/2015 10:15 AM Performed by: Henrietta HooverPOPE, Lamesha Tibbits Pre-anesthesia Checklist: Patient identified, Emergency Drugs available, Suction available, Patient being monitored and Timeout performed Patient Re-evaluated:Patient Re-evaluated prior to inductionOxygen Delivery Method: Circle system utilized Preoxygenation: Pre-oxygenation with 100% oxygen Intubation Type: IV induction Ventilation: Mask ventilation without difficulty Laryngoscope Size: Mac and 3 Grade View: Grade II Tube type: Oral Tube size: 7.5 mm Number of attempts: 1 Secured at: 24 cm Tube secured with: Tape Dental Injury: Teeth and Oropharynx as per pre-operative assessment

## 2015-07-28 NOTE — Anesthesia Postprocedure Evaluation (Signed)
Anesthesia Post Note  Patient: Todd SensingStephen Bushee  Procedure(s) Performed: Procedure(s) (LRB): LAPAROSCOPIC INGUINAL HERNIA (Bilateral)  Patient location during evaluation: PACU Anesthesia Type: General Level of consciousness: awake and alert Pain management: pain level controlled Vital Signs Assessment: post-procedure vital signs reviewed and stable Respiratory status: spontaneous breathing, nonlabored ventilation, respiratory function stable and patient connected to nasal cannula oxygen Cardiovascular status: blood pressure returned to baseline and stable Postop Assessment: no signs of nausea or vomiting Anesthetic complications: no    Last Vitals:  Filed Vitals:   07/27/15 1304 07/27/15 1343  BP: 135/85 133/72  Pulse: 66 74  Temp: 35.9 C   Resp: 16     Last Pain:  Filed Vitals:   07/28/15 1322  PainSc: 0-No pain                 Lenard SimmerAndrew Sharman Garrott

## 2015-08-03 ENCOUNTER — Encounter: Payer: Self-pay | Admitting: General Surgery

## 2015-08-05 ENCOUNTER — Encounter: Payer: Self-pay | Admitting: General Surgery

## 2015-08-05 ENCOUNTER — Ambulatory Visit (INDEPENDENT_AMBULATORY_CARE_PROVIDER_SITE_OTHER): Payer: Worker's Compensation | Admitting: General Surgery

## 2015-08-05 VITALS — BP 130/82 | HR 88 | Resp 14 | Ht 68.0 in | Wt 216.0 lb

## 2015-08-05 DIAGNOSIS — K4090 Unilateral inguinal hernia, without obstruction or gangrene, not specified as recurrent: Secondary | ICD-10-CM

## 2015-08-05 NOTE — Patient Instructions (Addendum)
The patient is aware to call back for any questions or concerns. Follow up one month. Resume activities as tolerated.

## 2015-08-05 NOTE — Progress Notes (Signed)
Here today for postoperative visit, bilateral lap/inguinal hernia repair on 07-27-15. Both sides repaired with Bard 3D mesh.He states he is doing well. Still a little sore. Bowels moving daily. I have reviewed the history of present illness with the patient.  Port sites well healed, repair intact. Abdomen soft. Follow up one month. Resume activities as tolerated.   PCP:  Aurelio Brashurham, Graham This information has been scribed by Dorathy DaftMarsha Hatch RNBC.

## 2015-08-24 ENCOUNTER — Telehealth: Payer: Self-pay | Admitting: *Deleted

## 2015-08-24 NOTE — Telephone Encounter (Signed)
Return to work note needed, completed, pt aware to pick up. No restrictions per Dr Evette Cristal.

## 2015-09-06 ENCOUNTER — Ambulatory Visit: Payer: Self-pay | Admitting: General Surgery

## 2020-11-25 ENCOUNTER — Other Ambulatory Visit: Payer: Self-pay

## 2020-11-25 ENCOUNTER — Emergency Department: Payer: BC Managed Care – PPO

## 2020-11-25 ENCOUNTER — Emergency Department
Admission: EM | Admit: 2020-11-25 | Discharge: 2020-11-25 | Disposition: A | Discharge status: Home / Self Care | Payer: BC Managed Care – PPO | Attending: Emergency Medicine | Admitting: Emergency Medicine

## 2020-11-25 DIAGNOSIS — I10 Essential (primary) hypertension: Secondary | ICD-10-CM | POA: Diagnosis not present

## 2020-11-25 DIAGNOSIS — Z79899 Other long term (current) drug therapy: Secondary | ICD-10-CM | POA: Diagnosis not present

## 2020-11-25 DIAGNOSIS — R1013 Epigastric pain: Secondary | ICD-10-CM | POA: Diagnosis not present

## 2020-11-25 DIAGNOSIS — R1010 Upper abdominal pain, unspecified: Secondary | ICD-10-CM | POA: Diagnosis present

## 2020-11-25 DIAGNOSIS — Z87891 Personal history of nicotine dependence: Secondary | ICD-10-CM | POA: Insufficient documentation

## 2020-11-25 DIAGNOSIS — R112 Nausea with vomiting, unspecified: Secondary | ICD-10-CM | POA: Insufficient documentation

## 2020-11-25 DIAGNOSIS — K219 Gastro-esophageal reflux disease without esophagitis: Secondary | ICD-10-CM | POA: Insufficient documentation

## 2020-11-25 DIAGNOSIS — J449 Chronic obstructive pulmonary disease, unspecified: Secondary | ICD-10-CM | POA: Insufficient documentation

## 2020-11-25 LAB — CBC
HCT: 50.8 % (ref 39.0–52.0)
Hemoglobin: 17.2 g/dL — ABNORMAL HIGH (ref 13.0–17.0)
MCH: 29.2 pg (ref 26.0–34.0)
MCHC: 33.9 g/dL (ref 30.0–36.0)
MCV: 86.1 fL (ref 80.0–100.0)
Platelets: 291 10*3/uL (ref 150–400)
RBC: 5.9 MIL/uL — ABNORMAL HIGH (ref 4.22–5.81)
RDW: 12.2 % (ref 11.5–15.5)
WBC: 12.4 10*3/uL — ABNORMAL HIGH (ref 4.0–10.5)
nRBC: 0 % (ref 0.0–0.2)

## 2020-11-25 LAB — COMPREHENSIVE METABOLIC PANEL
ALT: 23 U/L (ref 0–44)
AST: 26 U/L (ref 15–41)
Albumin: 4.6 g/dL (ref 3.5–5.0)
Alkaline Phosphatase: 62 U/L (ref 38–126)
Anion gap: 11 (ref 5–15)
BUN: 20 mg/dL (ref 6–20)
CO2: 25 mmol/L (ref 22–32)
Calcium: 9 mg/dL (ref 8.9–10.3)
Chloride: 103 mmol/L (ref 98–111)
Creatinine, Ser: 0.98 mg/dL (ref 0.61–1.24)
GFR, Estimated: >60 mL/min (ref >60)
Glucose, Bld: 123 mg/dL — ABNORMAL HIGH (ref 70–99)
Potassium: 4.1 mmol/L (ref 3.5–5.1)
Sodium: 139 mmol/L (ref 135–145)
Total Bilirubin: 0.8 mg/dL (ref 0.3–1.2)
Total Protein: 7.9 g/dL (ref 6.5–8.1)

## 2020-11-25 LAB — LIPASE, BLOOD: Lipase: 34 U/L (ref 11–51)

## 2020-11-25 LAB — TROPONIN I (HIGH SENSITIVITY): Troponin I (High Sensitivity): 3 ng/L (ref <18)

## 2020-11-25 MED ORDER — PANTOPRAZOLE SODIUM 40 MG IV SOLR
40.0000 mg | Freq: Once | INTRAVENOUS | Status: AC
  Administered 2020-11-25: 40 mg via INTRAVENOUS
  Filled 2020-11-25: qty 40

## 2020-11-25 MED ORDER — SODIUM CHLORIDE 0.9 % IV BOLUS
1000.0000 mL | Freq: Once | INTRAVENOUS | Status: AC
  Administered 2020-11-25: 1000 mL via INTRAVENOUS

## 2020-11-25 MED ORDER — SODIUM CHLORIDE 0.9 % IV BOLUS: 1000.0000 mL | Freq: Once | INTRAVENOUS | Status: DC

## 2020-11-25 MED ORDER — ALUM & MAG HYDROXIDE-SIMETH 200-200-20 MG/5ML PO SUSP
30.0000 mL | Freq: Once | ORAL | Status: AC
  Administered 2020-11-25: 30 mL via ORAL
  Filled 2020-11-25: qty 30

## 2020-11-25 MED ORDER — SUCRALFATE 1 G PO TABS: 1.0000 g | ORAL_TABLET | Freq: Four times a day (QID) | ORAL | 0 refills | Status: AC

## 2020-11-25 MED ORDER — LORAZEPAM 2 MG/ML IJ SOLN: 0.5000 mg | Freq: Once | INTRAMUSCULAR | Status: DC

## 2020-11-25 MED ORDER — ONDANSETRON HCL 4 MG/2ML IJ SOLN
4.0000 mg | Freq: Once | INTRAMUSCULAR | Status: AC | PRN
  Administered 2020-11-25: 4 mg via INTRAVENOUS
  Filled 2020-11-25: qty 2

## 2020-11-25 MED ORDER — IOHEXOL 300 MG/ML  SOLN
100.0000 mL | Freq: Once | INTRAMUSCULAR | Status: AC | PRN
  Administered 2020-11-25: 100 mL via INTRAVENOUS

## 2020-11-25 MED ORDER — FAMOTIDINE 20 MG PO TABS: 20.0000 mg | ORAL_TABLET | Freq: Every day | ORAL | 1 refills | Status: AC

## 2020-11-25 NOTE — ED Provider Notes (Signed)
[place] without any acute findings. Patient does state that he is feeling better after medication. At this time will plan on treating for gastritis. Discussed plan with patient. Encouraged PCP follow up.   Phineas Semen, MD 11/25/20 [phone]

## 2020-11-25 NOTE — ED Provider Notes (Signed)
[facility] Emergency Department Provider Note  ____________________________________________   Event Date/Time   First MD Initiated Contact with Patient 11/25/20 1331     (approximate)  I have reviewed the triage vital signs and the nursing notes.   HISTORY  Chief Complaint Emesis    HPI Todd Walter is a 55 y.o. male with COPD, GERD, hiatal hernia who comes in for vomiting that started at 630 this morning. At left over pizza at [facility].  Then around 9AM he has had lots of vomiting. Now better after the zofran. Does have some abdominal pain with it. Has had inguinal hernia mesh.  Patient states the abdominal pain is in his upper abdomen, mild, constant.  Denies any alcohol use or drug use recently          Past Medical History:  Diagnosis Date  . Anxiety   . COPD (chronic obstructive pulmonary disease) (HCC)   . Depression   . GERD (gastroesophageal reflux disease)   . H/O syncope 1995  . Hiatal hernia   . Hypertension   . Prostate enlargement   . Sleep apnea     There are no problems to display for this patient.   Past Surgical History:  Procedure Laterality Date  . COLONOSCOPY  2012  . INGUINAL HERNIA REPAIR Bilateral 07/27/2015   Procedure: LAPAROSCOPIC INGUINAL HERNIA;  Surgeon: Kieth Brightly, MD;  Location: [facility] ORS;  Service: General;  Laterality: Bilateral;  consented for left, once inside noticed right sided defect as well  . WISDOM TOOTH EXTRACTION      Prior to Admission medications   Medication Sig Start Date End Date Taking? Authorizing Provider  acetaminophen (TYLENOL) 500 MG tablet Take 1,000 mg by mouth every 6 (six) hours as needed.    [provider]  FLUoxetine (PROZAC) 20 MG tablet Take 20 mg by mouth daily.    [provider]  hydrochlorothiazide (HYDRODIURIL) 25 MG tablet Take 25 mg by mouth daily.  04/27/15   [provider]  meloxicam (MOBIC) 15 MG tablet Take 7.5 mg by mouth daily.   06/28/15   [provider]  omeprazole (PRILOSEC) 20 MG capsule Take 20 mg by mouth 2 (two) times daily.  06/02/15   [provider]  oxyCODONE-acetaminophen (ROXICET) 5-325 MG tablet Take 1 tablet by mouth every 4 (four) hours as needed. 07/27/15   Kieth Brightly, MD    Allergies Patient has no known allergies.  Family History  Problem Relation Age of Onset  . Cancer Father        kidney  . Cancer Brother     Social History Social History   Tobacco Use  . Smoking status: Former Smoker    Years: 25.00    Quit date: 07/25/2007    Years since quitting: 13.3  . Smokeless tobacco: Former Neurosurgeon    Types: Snuff    Quit date: 07/25/2007  Substance Use Topics  . Alcohol use: Yes    Alcohol/week: 0.0 standard drinks    Comment: occasionally  . Drug use: No      Review of Systems Constitutional: No fever/chills Eyes: No visual changes. ENT: No sore throat. Cardiovascular: Denies chest pain. Respiratory: Denies shortness of breath. Gastrointestinal: Positive abdominal pain, nausea, vomiting Genitourinary: Negative for dysuria. Musculoskeletal: Negative for back pain. Skin: Negative for rash. Neurological: Negative for headaches, focal weakness or numbness. All other ROS negative ____________________________________________   PHYSICAL EXAM:  VITAL SIGNS: ED Triage Vitals  Enc Vitals Group  BP 11/25/20 1330 (!) 142/98     Pulse Rate 11/25/20 1330 (!) 106     Resp 11/25/20 1330 20     Temp 11/25/20 1330 98.5 F (36.9 C)     Temp Source 11/25/20 1330 Oral     SpO2 11/25/20 1330 99 %     Weight 11/25/20 1327 225 lb (102.1 kg)     Height 11/25/20 1327 5\' 8"  (1.727 m)     Head Circumference --      Peak Flow --      Pain Score 11/25/20 1326 8     Pain Loc --      Pain Edu? --      Excl. in GC? --     Constitutional: Alert and oriented. Well appearing and in no acute distress. Eyes: Conjunctivae are normal. EOMI. Head: Atraumatic. Nose: No  congestion/rhinnorhea. Mouth/Throat: Mucous membranes are moist.   Neck: No stridor. Trachea Midline. FROM Cardiovascular: Normal rate, regular rhythm. Grossly normal heart sounds.  Good peripheral circulation. Respiratory: Normal respiratory effort.  No retractions. Lungs CTAB. Gastrointestinal: Slight epigastric tenderness no distention. No abdominal bruits.  Musculoskeletal: No lower extremity tenderness nor edema.  No joint effusions. Neurologic:  Normal speech and language. No gross focal neurologic deficits are appreciated.  Skin:  Skin is warm, dry and intact. No rash noted. Psychiatric: Mood and affect are normal. Speech and behavior are normal. GU: Deferred   ____________________________________________   LABS (all labs ordered are listed, but only abnormal results are displayed)  Labs Reviewed  COMPREHENSIVE METABOLIC PANEL - Abnormal; Notable for the following components:      Result Value   Glucose, Bld 123 (*)    All other components within normal limits  CBC - Abnormal; Notable for the following components:   WBC 12.4 (*)    RBC 5.90 (*)    Hemoglobin 17.2 (*)    All other components within normal limits  LIPASE, BLOOD  URINALYSIS, COMPLETE (UACMP) WITH MICROSCOPIC  TROPONIN I (HIGH SENSITIVITY)   ____________________________________________   ED ECG REPORT I, 01/25/21, the attending physician, personally viewed and interpreted this ECG.  Normal sinus rate 97, no ST elevation, T wave version in lead III, normal intervals ____________________________________________  RADIOLOGY Concha Se, personally viewed and evaluated these images (plain radiographs) as part of my medical decision making, as well as reviewing the written report by the radiologist.  ED MD interpretation:  Pending   Official radiology report(s): No results found.  ____________________________________________   PROCEDURES  Procedure(s) performed (including Critical  Care):  Procedures   ____________________________________________   INITIAL IMPRESSION / ASSESSMENT AND PLAN / ED COURSE  Todd Walter was evaluated in Emergency Department on 11/25/2020 for the symptoms described in the history of present illness. He was evaluated in the context of the global COVID-19 pandemic, which necessitated consideration that the patient might be at risk for infection with the SARS-CoV-2 virus that causes COVID-19. Institutional protocols and algorithms that pertain to the evaluation of patients at risk for COVID-19 are in a state of rapid change based on information released by regulatory bodies including the CDC and federal and state organizations. These policies and algorithms were followed during the patient's care in the ED.    Patient is a 55 year old who comes in with epigastric abdominal pain and nausea and vomiting.  Could be gastritis versus gallbladder pathology versus ulcer.  Initially had ordered CT scan to get a full look at everything but patient reports having  a lot of anxiety and does not want to get the CT scan even with Ativan.  Given patient has no lower abdominal tenderness to suggest diverticulitis or appendicitis I think this is okay.  He does have a hernia reports some chronic intermittent pain there but that is been going on for over a year.  Therefore we will proceed with ultrasound to further evaluate  Patient given some somatic treatment for possible gastritis  Cardiac markers negative and symptoms have been going on for greater than 3 hours.  Labs are reassuring except for slight elevation of his white count.  Patient handed off to oncoming team pending ultrasound and reevaluation after medications       ____________________________________________   FINAL CLINICAL IMPRESSION(S) / ED DIAGNOSES   Final diagnoses:  Epigastric abdominal pain      MEDICATIONS GIVEN DURING THIS VISIT:  Medications  pantoprazole (PROTONIX) injection  40 mg (has no administration in time range)  alum & mag hydroxide-simeth (MAALOX/MYLANTA) 200-200-20 MG/5ML suspension 30 mL (has no administration in time range)  ondansetron (ZOFRAN) injection 4 mg (4 mg Intravenous Given 11/25/20 1348)  sodium chloride 0.9 % bolus 1,000 mL (1,000 mLs Intravenous New Bag/Given 11/25/20 1428)  iohexol (OMNIPAQUE) 300 MG/ML solution 100 mL (100 mLs Intravenous Contrast Given 11/25/20 1437)     ED Discharge Orders    None       Note:  This document was prepared using Dragon voice recognition software and may include unintentional dictation errors.   Concha Se, MD 11/25/20 1536

## 2020-11-25 NOTE — ED Triage Notes (Signed)
Pt started vomiting this am around 630. Pt denies contributing factors. No other family members ill at this time. Pt c/o indigestion yesterday.

## 2020-11-25 NOTE — Discharge Instructions (Signed)
Please seek medical attention for any high fevers, chest pain, shortness of breath, change in behavior, persistent vomiting, bloody stool or any other new or concerning symptoms.  

## 2021-12-15 IMAGING — US US ABDOMEN LIMITED RUQ/ASCITES
1 series · 14 of 25 positions shown · non-contrast
Comparison: 05/11/2006

CLINICAL DATA: Epigastric abdominal pain, nausea and vomiting for 1
day

EXAM:
ULTRASOUND ABDOMEN LIMITED RIGHT UPPER QUADRANT

[Series 1: us abdomen limited ruq (liver/gb) · 14 of 44 slices shown]
[im 1/44]
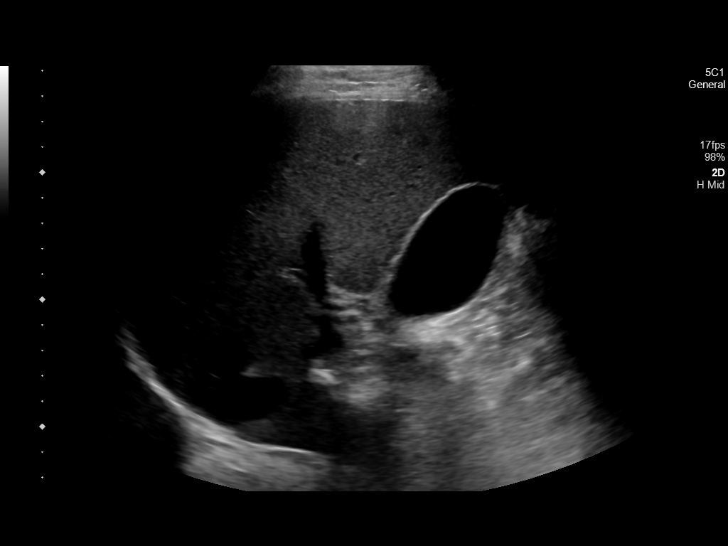
[im 4/44]
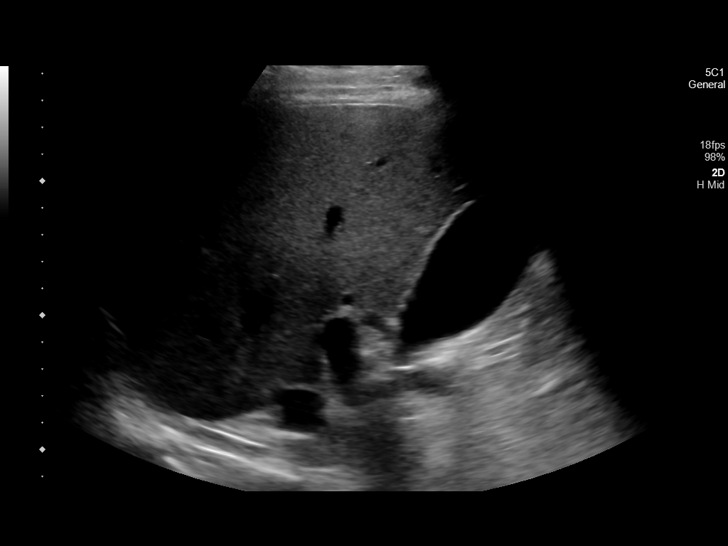
[im 8/44]
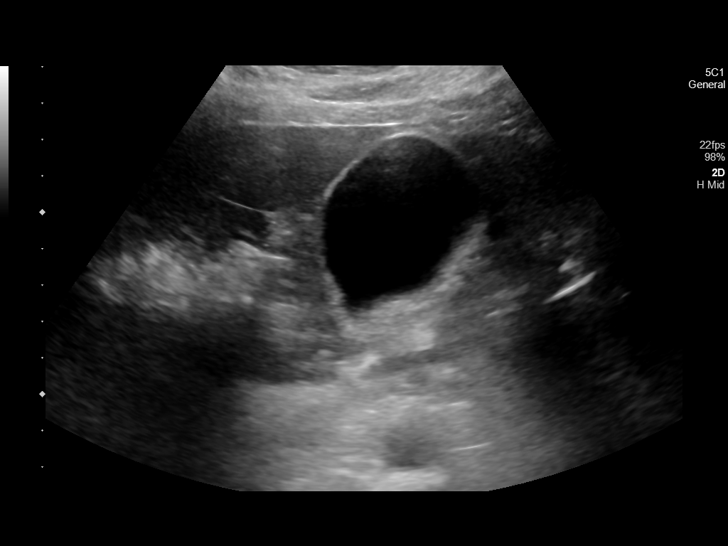
[im 11/44]
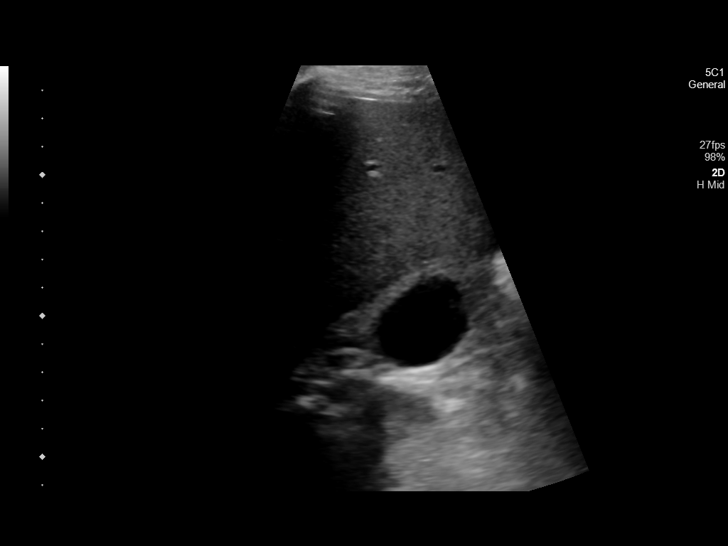
[im 15/44]
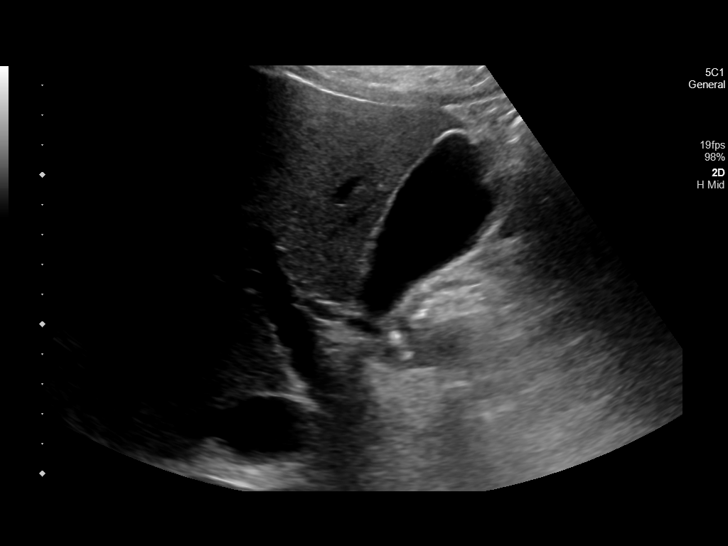
[im 17/44]
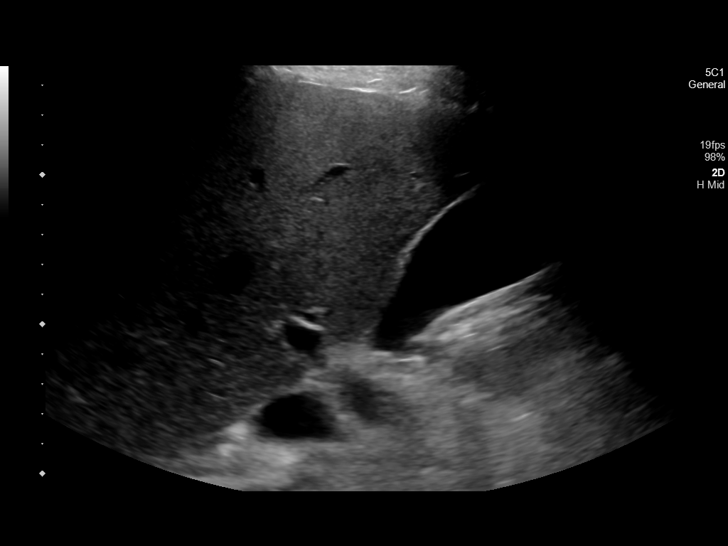
[im 20/44]
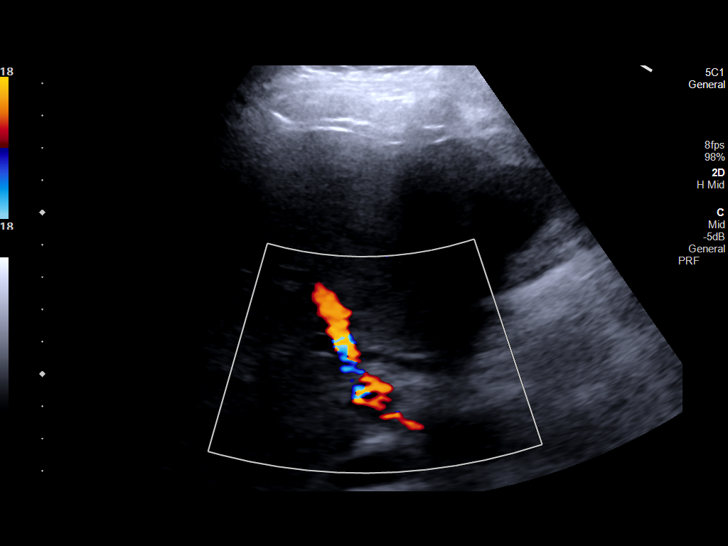
[im 24/44]
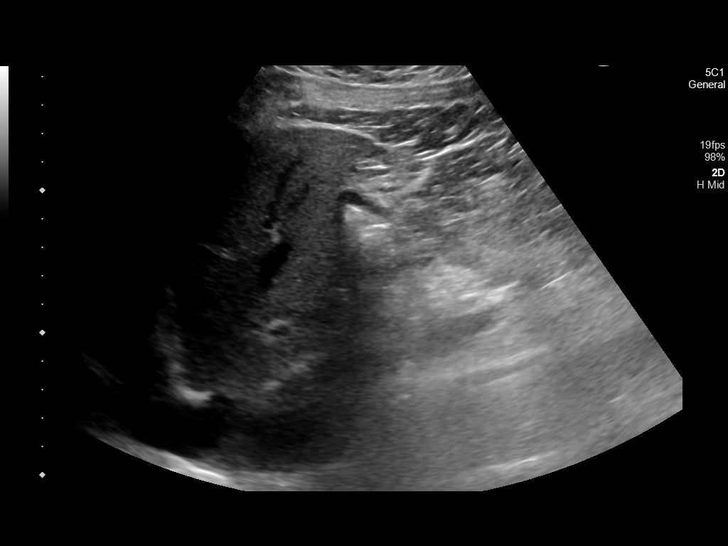
[im 27/44]
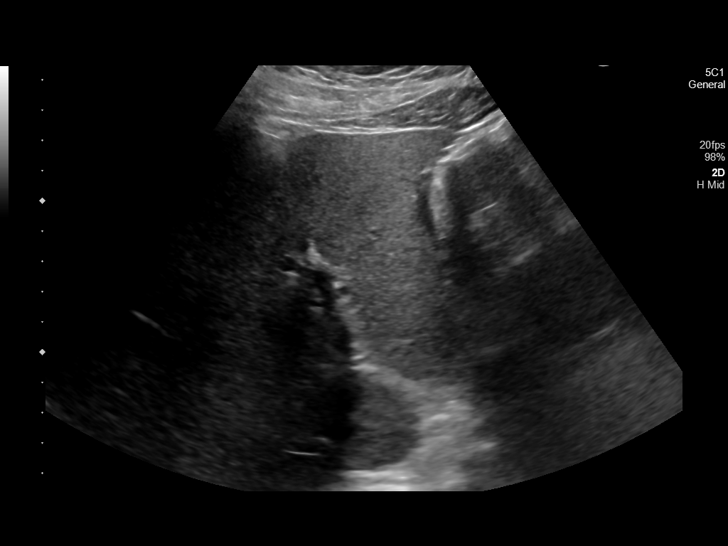
[im 29/44]
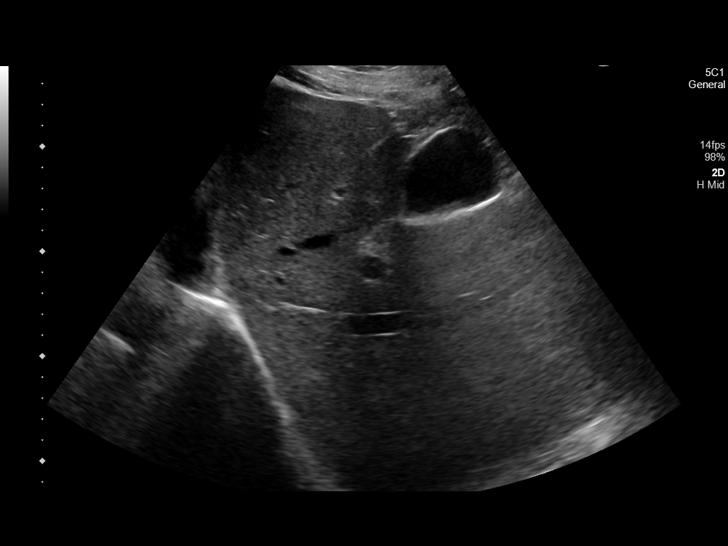
[im 33/44]
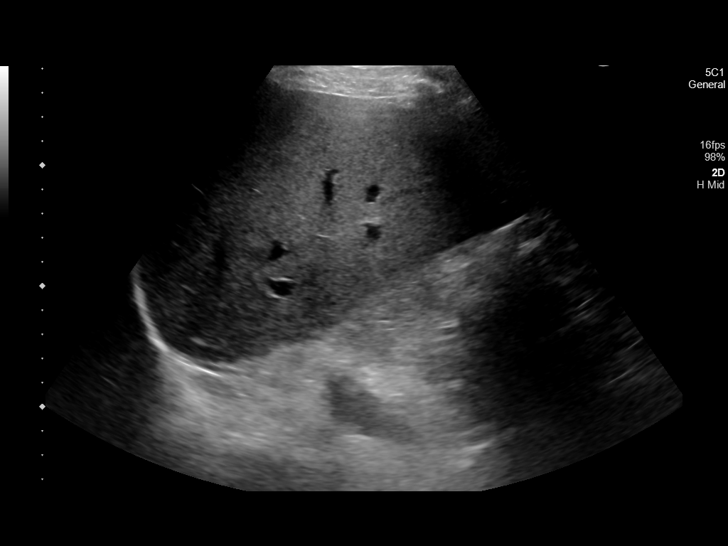
[im 36/44]
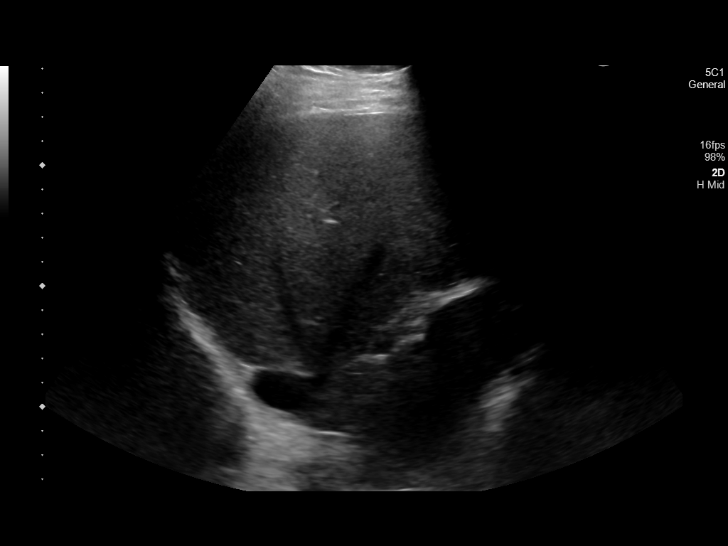
[im 40/44]
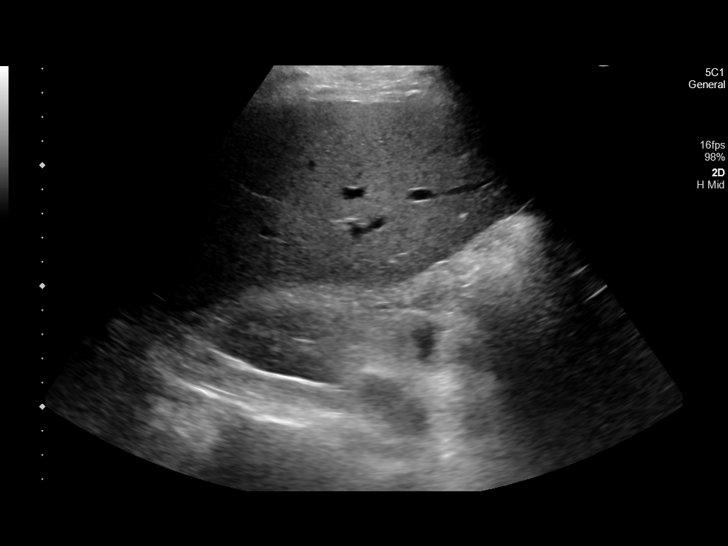
[im 44/44]
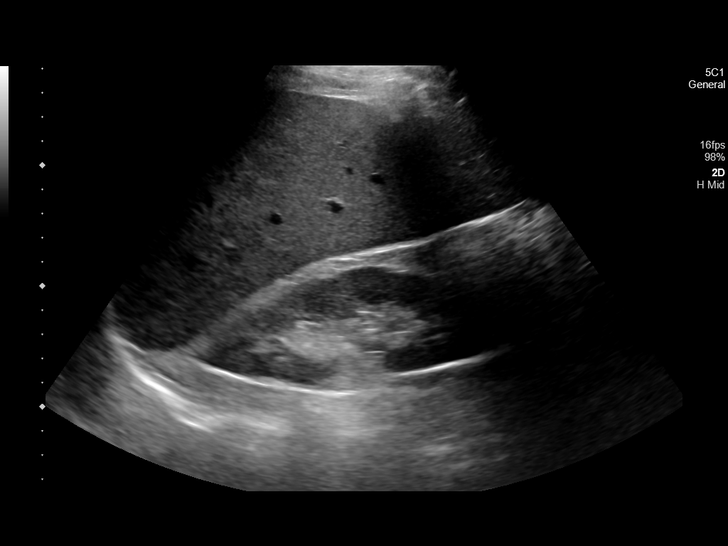

[14 of 25 positions shown; findings below may reference images not displayed]

FINDINGS: Gallbladder:

No gallstones or wall thickening visualized. No sonographic Murphy
sign noted by sonographer.

Common bile duct:

Diameter: 5 mm

Liver:

No focal lesion identified. Within normal limits in parenchymal
echogenicity. Portal vein is patent on color Doppler imaging with
normal direction of blood flow towards the liver.

Other: None.
IMPRESSION: 1. Unremarkable right upper quadrant ultrasound.
